# Patient Record
Sex: Female | Born: 1945
Health system: Southern US, Community
[De-identification: ages and names within clinical notes are randomized; demographics above are authoritative.]

## PROBLEM LIST (undated history)

## (undated) DIAGNOSIS — E7800 Pure hypercholesterolemia, unspecified: Secondary | ICD-10-CM

## (undated) DIAGNOSIS — J45991 Cough variant asthma: Secondary | ICD-10-CM

## (undated) DIAGNOSIS — M810 Age-related osteoporosis without current pathological fracture: Secondary | ICD-10-CM

## (undated) DIAGNOSIS — N898 Other specified noninflammatory disorders of vagina: Secondary | ICD-10-CM

## (undated) DIAGNOSIS — R253 Fasciculation: Secondary | ICD-10-CM

## (undated) DIAGNOSIS — B690 Cysticercosis of central nervous system: Secondary | ICD-10-CM

## (undated) DIAGNOSIS — Z79899 Other long term (current) drug therapy: Secondary | ICD-10-CM

## (undated) DIAGNOSIS — B351 Tinea unguium: Secondary | ICD-10-CM

## (undated) DIAGNOSIS — E559 Vitamin D deficiency, unspecified: Secondary | ICD-10-CM

## (undated) DIAGNOSIS — R1310 Dysphagia, unspecified: Secondary | ICD-10-CM

## (undated) DIAGNOSIS — G4730 Sleep apnea, unspecified: Secondary | ICD-10-CM

## (undated) DIAGNOSIS — I700 Atherosclerosis of aorta: Secondary | ICD-10-CM

## (undated) DIAGNOSIS — N951 Menopausal and female climacteric states: Secondary | ICD-10-CM

## (undated) DIAGNOSIS — H269 Unspecified cataract: Secondary | ICD-10-CM

## (undated) DIAGNOSIS — R490 Dysphonia: Secondary | ICD-10-CM

## (undated) DIAGNOSIS — L218 Other seborrheic dermatitis: Secondary | ICD-10-CM

## (undated) HISTORY — PX: ABDOMINAL HYSTERECTOMY: SUR658

## (undated) HISTORY — DX: Age-related osteoporosis without current pathological fracture: M81.0

## (undated) HISTORY — PX: LESION REMOVAL: SHX5196

## (undated) HISTORY — DX: Sleep apnea, unspecified: G47.30

## (undated) HISTORY — DX: Dysphonia: R49.0

## (undated) HISTORY — DX: Unspecified cataract: H26.9

## (undated) HISTORY — DX: Menopausal and female climacteric states: N95.1

## (undated) HISTORY — DX: Atherosclerosis of aorta: I70.0

## (undated) HISTORY — DX: Other seborrheic dermatitis: L21.8

## (undated) HISTORY — DX: Dysphagia, unspecified: R13.10

## (undated) HISTORY — DX: Cough variant asthma: J45.991

## (undated) HISTORY — PX: EYE MUSCLE SURGERY: SHX370

## (undated) HISTORY — DX: Other long term (current) drug therapy: Z79.899

## (undated) HISTORY — DX: Other specified noninflammatory disorders of vagina: N89.8

## (undated) HISTORY — DX: Fasciculation: R25.3

## (undated) HISTORY — PX: ESOPHAGOGASTRODUODENOSCOPY: SHX1529

## (undated) HISTORY — DX: Pure hypercholesterolemia, unspecified: E78.00

## (undated) HISTORY — DX: Tinea unguium: B35.1

## (undated) HISTORY — DX: Vitamin D deficiency, unspecified: E55.9

## (undated) HISTORY — PX: TONSILLECTOMY: SUR1361

## (undated) HISTORY — PX: WISDOM TOOTH EXTRACTION: SHX21

## (undated) HISTORY — DX: Cysticercosis of central nervous system: B69.0

---

## 2000-01-31 ENCOUNTER — Encounter: Payer: Self-pay | Admitting: Family Medicine

## 2000-01-31 ENCOUNTER — Encounter: Admission: RE | Admit: 2000-01-31 | Discharge: 2000-01-31 | Payer: Self-pay | Admitting: Family Medicine

## 2000-05-17 ENCOUNTER — Encounter (INDEPENDENT_AMBULATORY_CARE_PROVIDER_SITE_OTHER): Payer: Self-pay

## 2000-05-18 ENCOUNTER — Other Ambulatory Visit: Admission: RE | Admit: 2000-05-18 | Discharge: 2000-05-18 | Payer: Self-pay | Admitting: Obstetrics and Gynecology

## 2001-02-06 ENCOUNTER — Encounter: Admission: RE | Admit: 2001-02-06 | Discharge: 2001-02-06 | Payer: Self-pay | Admitting: Obstetrics and Gynecology

## 2001-02-06 ENCOUNTER — Encounter: Payer: Self-pay | Admitting: Obstetrics and Gynecology

## 2001-08-21 ENCOUNTER — Encounter: Payer: Self-pay | Admitting: Family Medicine

## 2001-08-21 ENCOUNTER — Encounter: Admission: RE | Admit: 2001-08-21 | Discharge: 2001-08-21 | Payer: Self-pay | Admitting: Family Medicine

## 2002-02-07 ENCOUNTER — Encounter: Admission: RE | Admit: 2002-02-07 | Discharge: 2002-02-07 | Payer: Self-pay | Admitting: Family Medicine

## 2002-02-07 ENCOUNTER — Encounter: Payer: Self-pay | Admitting: Family Medicine

## 2003-03-05 ENCOUNTER — Encounter: Admission: RE | Admit: 2003-03-05 | Discharge: 2003-03-05 | Payer: Self-pay | Admitting: Family Medicine

## 2003-03-05 ENCOUNTER — Encounter: Payer: Self-pay | Admitting: Family Medicine

## 2003-07-01 ENCOUNTER — Encounter: Admission: RE | Admit: 2003-07-01 | Discharge: 2003-08-03 | Payer: Self-pay | Admitting: Family Medicine

## 2004-05-06 ENCOUNTER — Encounter: Admission: RE | Admit: 2004-05-06 | Discharge: 2004-05-06 | Payer: Self-pay | Admitting: Family Medicine

## 2004-12-19 ENCOUNTER — Ambulatory Visit (HOSPITAL_COMMUNITY): Admission: RE | Admit: 2004-12-19 | Discharge: 2004-12-19 | Payer: Self-pay | Admitting: *Deleted

## 2005-06-21 ENCOUNTER — Encounter: Admission: RE | Admit: 2005-06-21 | Discharge: 2005-06-21 | Payer: Self-pay | Admitting: Family Medicine

## 2005-10-30 ENCOUNTER — Encounter: Admission: RE | Admit: 2005-10-30 | Discharge: 2005-12-18 | Payer: Self-pay | Admitting: Family Medicine

## 2005-11-13 ENCOUNTER — Ambulatory Visit: Payer: Self-pay | Admitting: Internal Medicine

## 2005-12-06 ENCOUNTER — Ambulatory Visit (HOSPITAL_COMMUNITY): Admission: RE | Admit: 2005-12-06 | Discharge: 2005-12-06 | Payer: Self-pay | Admitting: Internal Medicine

## 2005-12-14 ENCOUNTER — Ambulatory Visit: Payer: Self-pay | Admitting: Internal Medicine

## 2006-01-10 ENCOUNTER — Ambulatory Visit: Payer: Self-pay | Admitting: Internal Medicine

## 2006-07-03 ENCOUNTER — Encounter: Admission: RE | Admit: 2006-07-03 | Discharge: 2006-07-03 | Payer: Self-pay | Admitting: Family Medicine

## 2006-11-21 ENCOUNTER — Other Ambulatory Visit: Admission: RE | Admit: 2006-11-21 | Discharge: 2006-11-21 | Payer: Self-pay | Admitting: Family Medicine

## 2007-07-05 ENCOUNTER — Encounter: Admission: RE | Admit: 2007-07-05 | Discharge: 2007-07-05 | Payer: Self-pay | Admitting: Family Medicine

## 2008-07-21 ENCOUNTER — Encounter: Admission: RE | Admit: 2008-07-21 | Discharge: 2008-07-21 | Payer: Self-pay | Admitting: Family Medicine

## 2009-08-05 ENCOUNTER — Encounter: Admission: RE | Admit: 2009-08-05 | Discharge: 2009-08-05 | Payer: Self-pay | Admitting: Family Medicine

## 2009-11-18 ENCOUNTER — Ambulatory Visit (HOSPITAL_COMMUNITY): Admission: RE | Admit: 2009-11-18 | Discharge: 2009-11-18 | Payer: Self-pay | Admitting: Gastroenterology

## 2010-02-27 ENCOUNTER — Emergency Department (HOSPITAL_COMMUNITY): Admission: EM | Admit: 2010-02-27 | Discharge: 2010-02-27 | Payer: Self-pay | Admitting: Emergency Medicine

## 2010-08-08 ENCOUNTER — Encounter: Admission: RE | Admit: 2010-08-08 | Discharge: 2010-08-08 | Payer: Self-pay | Admitting: Family Medicine

## 2010-11-16 ENCOUNTER — Other Ambulatory Visit: Payer: Self-pay | Admitting: Family Medicine

## 2010-11-16 ENCOUNTER — Ambulatory Visit
Admission: RE | Admit: 2010-11-16 | Discharge: 2010-11-16 | Disposition: A | Discharge status: Home / Self Care | Payer: BC Managed Care – PPO | Source: Ambulatory Visit | Attending: Family Medicine | Admitting: Family Medicine

## 2010-11-16 DIAGNOSIS — M542 Cervicalgia: Secondary | ICD-10-CM

## 2010-11-16 DIAGNOSIS — M5410 Radiculopathy, site unspecified: Secondary | ICD-10-CM

## 2010-11-22 ENCOUNTER — Other Ambulatory Visit: Payer: Self-pay | Admitting: Family Medicine

## 2010-11-22 DIAGNOSIS — D496 Neoplasm of unspecified behavior of brain: Secondary | ICD-10-CM

## 2010-11-28 ENCOUNTER — Ambulatory Visit
Admission: RE | Admit: 2010-11-28 | Discharge: 2010-11-28 | Disposition: A | Discharge status: Home / Self Care | Payer: BC Managed Care – PPO | Source: Ambulatory Visit | Attending: Family Medicine | Admitting: Family Medicine

## 2010-11-28 DIAGNOSIS — D496 Neoplasm of unspecified behavior of brain: Secondary | ICD-10-CM

## 2011-05-08 ENCOUNTER — Other Ambulatory Visit: Payer: Self-pay | Admitting: Neurosurgery

## 2011-05-08 DIAGNOSIS — G930 Cerebral cysts: Secondary | ICD-10-CM

## 2011-06-07 ENCOUNTER — Ambulatory Visit
Admission: RE | Admit: 2011-06-07 | Discharge: 2011-06-07 | Disposition: A | Discharge status: Home / Self Care | Payer: Medicare Other | Source: Ambulatory Visit | Attending: Neurosurgery | Admitting: Neurosurgery

## 2011-06-07 DIAGNOSIS — G930 Cerebral cysts: Secondary | ICD-10-CM

## 2011-07-11 ENCOUNTER — Other Ambulatory Visit: Payer: Self-pay | Admitting: Obstetrics and Gynecology

## 2011-07-11 DIAGNOSIS — Z1231 Encounter for screening mammogram for malignant neoplasm of breast: Secondary | ICD-10-CM

## 2011-08-17 ENCOUNTER — Ambulatory Visit: Payer: Medicare Other

## 2011-08-30 ENCOUNTER — Ambulatory Visit: Payer: Medicare Other

## 2011-09-21 ENCOUNTER — Ambulatory Visit
Admission: RE | Admit: 2011-09-21 | Discharge: 2011-09-21 | Disposition: A | Discharge status: Home / Self Care | Payer: Medicare Other | Source: Ambulatory Visit | Attending: Obstetrics and Gynecology | Admitting: Obstetrics and Gynecology

## 2011-09-21 DIAGNOSIS — Z1231 Encounter for screening mammogram for malignant neoplasm of breast: Secondary | ICD-10-CM

## 2011-11-08 DIAGNOSIS — E7800 Pure hypercholesterolemia, unspecified: Secondary | ICD-10-CM | POA: Diagnosis not present

## 2011-11-08 DIAGNOSIS — M949 Disorder of cartilage, unspecified: Secondary | ICD-10-CM | POA: Diagnosis not present

## 2011-11-08 DIAGNOSIS — Z0000 Encounter for general adult medical examination without abnormal findings: Secondary | ICD-10-CM | POA: Diagnosis not present

## 2011-11-08 DIAGNOSIS — Z79899 Other long term (current) drug therapy: Secondary | ICD-10-CM | POA: Diagnosis not present

## 2011-11-08 DIAGNOSIS — M899 Disorder of bone, unspecified: Secondary | ICD-10-CM | POA: Diagnosis not present

## 2011-12-12 DIAGNOSIS — H26499 Other secondary cataract, unspecified eye: Secondary | ICD-10-CM | POA: Diagnosis not present

## 2011-12-12 DIAGNOSIS — H2510 Age-related nuclear cataract, unspecified eye: Secondary | ICD-10-CM | POA: Diagnosis not present

## 2011-12-12 DIAGNOSIS — H04129 Dry eye syndrome of unspecified lacrimal gland: Secondary | ICD-10-CM | POA: Diagnosis not present

## 2011-12-12 DIAGNOSIS — H1045 Other chronic allergic conjunctivitis: Secondary | ICD-10-CM | POA: Diagnosis not present

## 2012-01-09 DIAGNOSIS — M79609 Pain in unspecified limb: Secondary | ICD-10-CM | POA: Diagnosis not present

## 2012-01-09 DIAGNOSIS — B351 Tinea unguium: Secondary | ICD-10-CM | POA: Diagnosis not present

## 2012-02-06 DIAGNOSIS — B351 Tinea unguium: Secondary | ICD-10-CM | POA: Diagnosis not present

## 2012-02-06 DIAGNOSIS — M79609 Pain in unspecified limb: Secondary | ICD-10-CM | POA: Diagnosis not present

## 2012-04-08 DIAGNOSIS — L259 Unspecified contact dermatitis, unspecified cause: Secondary | ICD-10-CM | POA: Diagnosis not present

## 2012-05-07 DIAGNOSIS — B351 Tinea unguium: Secondary | ICD-10-CM | POA: Diagnosis not present

## 2012-05-07 DIAGNOSIS — M79609 Pain in unspecified limb: Secondary | ICD-10-CM | POA: Diagnosis not present

## 2012-06-30 DIAGNOSIS — Z23 Encounter for immunization: Secondary | ICD-10-CM | POA: Diagnosis not present

## 2012-09-02 ENCOUNTER — Other Ambulatory Visit: Payer: Self-pay | Admitting: Obstetrics and Gynecology

## 2012-09-02 DIAGNOSIS — Z1231 Encounter for screening mammogram for malignant neoplasm of breast: Secondary | ICD-10-CM

## 2012-09-23 DIAGNOSIS — H5310 Unspecified subjective visual disturbances: Secondary | ICD-10-CM | POA: Diagnosis not present

## 2012-09-23 DIAGNOSIS — H43819 Vitreous degeneration, unspecified eye: Secondary | ICD-10-CM | POA: Diagnosis not present

## 2012-09-23 DIAGNOSIS — H5319 Other subjective visual disturbances: Secondary | ICD-10-CM | POA: Diagnosis not present

## 2012-09-23 DIAGNOSIS — H4310 Vitreous hemorrhage, unspecified eye: Secondary | ICD-10-CM | POA: Diagnosis not present

## 2012-10-08 DIAGNOSIS — M47812 Spondylosis without myelopathy or radiculopathy, cervical region: Secondary | ICD-10-CM | POA: Diagnosis not present

## 2012-10-11 ENCOUNTER — Ambulatory Visit
Admission: RE | Admit: 2012-10-11 | Discharge: 2012-10-11 | Disposition: A | Discharge status: Home / Self Care | Payer: Medicare Other | Source: Ambulatory Visit | Attending: Obstetrics and Gynecology | Admitting: Obstetrics and Gynecology

## 2012-10-11 DIAGNOSIS — Z1231 Encounter for screening mammogram for malignant neoplasm of breast: Secondary | ICD-10-CM | POA: Diagnosis not present

## 2012-10-21 ENCOUNTER — Other Ambulatory Visit: Payer: Self-pay | Admitting: Neurosurgery

## 2012-10-21 DIAGNOSIS — M47812 Spondylosis without myelopathy or radiculopathy, cervical region: Secondary | ICD-10-CM

## 2012-10-21 DIAGNOSIS — G930 Cerebral cysts: Secondary | ICD-10-CM

## 2012-10-26 ENCOUNTER — Ambulatory Visit
Admission: RE | Admit: 2012-10-26 | Discharge: 2012-10-26 | Disposition: A | Discharge status: Home / Self Care | Payer: Medicare Other | Source: Ambulatory Visit | Attending: Neurosurgery | Admitting: Neurosurgery

## 2012-10-26 DIAGNOSIS — M47812 Spondylosis without myelopathy or radiculopathy, cervical region: Secondary | ICD-10-CM

## 2012-10-26 DIAGNOSIS — G930 Cerebral cysts: Secondary | ICD-10-CM

## 2012-11-07 DIAGNOSIS — M47812 Spondylosis without myelopathy or radiculopathy, cervical region: Secondary | ICD-10-CM | POA: Diagnosis not present

## 2012-11-07 DIAGNOSIS — H04129 Dry eye syndrome of unspecified lacrimal gland: Secondary | ICD-10-CM | POA: Diagnosis not present

## 2012-11-07 DIAGNOSIS — H4310 Vitreous hemorrhage, unspecified eye: Secondary | ICD-10-CM | POA: Diagnosis not present

## 2012-11-07 DIAGNOSIS — H43819 Vitreous degeneration, unspecified eye: Secondary | ICD-10-CM | POA: Diagnosis not present

## 2012-11-07 DIAGNOSIS — H2510 Age-related nuclear cataract, unspecified eye: Secondary | ICD-10-CM | POA: Diagnosis not present

## 2012-11-08 DIAGNOSIS — M949 Disorder of cartilage, unspecified: Secondary | ICD-10-CM | POA: Diagnosis not present

## 2012-11-08 DIAGNOSIS — E7800 Pure hypercholesterolemia, unspecified: Secondary | ICD-10-CM | POA: Diagnosis not present

## 2012-11-08 DIAGNOSIS — M899 Disorder of bone, unspecified: Secondary | ICD-10-CM | POA: Diagnosis not present

## 2012-11-08 DIAGNOSIS — R439 Unspecified disturbances of smell and taste: Secondary | ICD-10-CM | POA: Diagnosis not present

## 2012-11-08 DIAGNOSIS — M25559 Pain in unspecified hip: Secondary | ICD-10-CM | POA: Diagnosis not present

## 2012-11-08 DIAGNOSIS — Z79899 Other long term (current) drug therapy: Secondary | ICD-10-CM | POA: Diagnosis not present

## 2012-11-18 DIAGNOSIS — D2360 Other benign neoplasm of skin of unspecified upper limb, including shoulder: Secondary | ICD-10-CM | POA: Diagnosis not present

## 2012-11-19 DIAGNOSIS — J342 Deviated nasal septum: Secondary | ICD-10-CM | POA: Diagnosis not present

## 2012-11-19 DIAGNOSIS — R439 Unspecified disturbances of smell and taste: Secondary | ICD-10-CM | POA: Diagnosis not present

## 2012-12-02 DIAGNOSIS — R509 Fever, unspecified: Secondary | ICD-10-CM | POA: Diagnosis not present

## 2012-12-23 DIAGNOSIS — M899 Disorder of bone, unspecified: Secondary | ICD-10-CM | POA: Diagnosis not present

## 2013-02-06 DIAGNOSIS — R439 Unspecified disturbances of smell and taste: Secondary | ICD-10-CM | POA: Diagnosis not present

## 2013-02-18 DIAGNOSIS — J342 Deviated nasal septum: Secondary | ICD-10-CM | POA: Diagnosis not present

## 2013-02-18 DIAGNOSIS — R439 Unspecified disturbances of smell and taste: Secondary | ICD-10-CM | POA: Diagnosis not present

## 2013-03-14 DIAGNOSIS — R439 Unspecified disturbances of smell and taste: Secondary | ICD-10-CM | POA: Diagnosis not present

## 2013-03-14 DIAGNOSIS — E7800 Pure hypercholesterolemia, unspecified: Secondary | ICD-10-CM | POA: Diagnosis not present

## 2013-03-14 DIAGNOSIS — R509 Fever, unspecified: Secondary | ICD-10-CM | POA: Diagnosis not present

## 2013-03-14 DIAGNOSIS — Z79899 Other long term (current) drug therapy: Secondary | ICD-10-CM | POA: Diagnosis not present

## 2013-03-14 DIAGNOSIS — D485 Neoplasm of uncertain behavior of skin: Secondary | ICD-10-CM | POA: Diagnosis not present

## 2013-03-14 DIAGNOSIS — M25559 Pain in unspecified hip: Secondary | ICD-10-CM | POA: Diagnosis not present

## 2013-03-14 DIAGNOSIS — M899 Disorder of bone, unspecified: Secondary | ICD-10-CM | POA: Diagnosis not present

## 2013-03-19 DIAGNOSIS — Z79899 Other long term (current) drug therapy: Secondary | ICD-10-CM | POA: Diagnosis not present

## 2013-03-19 DIAGNOSIS — E7800 Pure hypercholesterolemia, unspecified: Secondary | ICD-10-CM | POA: Diagnosis not present

## 2013-03-19 DIAGNOSIS — B351 Tinea unguium: Secondary | ICD-10-CM | POA: Diagnosis not present

## 2013-06-30 DIAGNOSIS — Z23 Encounter for immunization: Secondary | ICD-10-CM | POA: Diagnosis not present

## 2013-07-08 ENCOUNTER — Emergency Department (INDEPENDENT_AMBULATORY_CARE_PROVIDER_SITE_OTHER)
Admission: EM | Admit: 2013-07-08 | Discharge: 2013-07-08 | Disposition: A | Discharge status: Home / Self Care | Payer: Medicare Other | Source: Home / Self Care | Attending: Family Medicine | Admitting: Family Medicine

## 2013-07-08 ENCOUNTER — Encounter (HOSPITAL_COMMUNITY): Payer: Self-pay | Admitting: Emergency Medicine

## 2013-07-08 ENCOUNTER — Emergency Department (INDEPENDENT_AMBULATORY_CARE_PROVIDER_SITE_OTHER): Payer: Medicare Other

## 2013-07-08 DIAGNOSIS — S8990XA Unspecified injury of unspecified lower leg, initial encounter: Secondary | ICD-10-CM

## 2013-07-08 DIAGNOSIS — M25579 Pain in unspecified ankle and joints of unspecified foot: Secondary | ICD-10-CM | POA: Diagnosis not present

## 2013-07-08 DIAGNOSIS — S99911A Unspecified injury of right ankle, initial encounter: Secondary | ICD-10-CM

## 2013-07-08 DIAGNOSIS — M7989 Other specified soft tissue disorders: Secondary | ICD-10-CM | POA: Diagnosis not present

## 2013-07-08 MED ORDER — TRAMADOL HCL 50 MG PO TABS: 50.0000 mg | ORAL_TABLET | Freq: Four times a day (QID) | ORAL | Status: DC | PRN

## 2013-07-08 NOTE — ED Provider Notes (Signed)
CSN: [id number]     Arrival date & time 07/08/13  0815 History   First MD Initiated Contact with Patient 07/08/13 [phone]     Chief Complaint  Patient presents with  . Fall   (Consider location/radiation/quality/duration/timing/severity/associated sxs/prior Treatment) HPI Comments: 67 year old female presents complaining of injuries after falling yesterday. She was walking and stepped on a branch moved under her and caused her to fall. The most significant pain today is in her right ankle which is swollen, and she has limited ability to bear weight. The pain is localized to her lateral right ankle. She denies any numbness distal to this. She does not remember if she inverted the ankle were hit it on anything because she states it happened too fast. She denies hitting her head. She also has a normal swelling of her left hand and mild pain, but this has improved significantly since yesterday.  She also has a slight abrasion on her left knee.    Patient is a 67 y.o. female presenting with fall.  Fall Pertinent negatives include no chest pain, no abdominal pain and no shortness of breath.    History reviewed. No pertinent past medical history. History reviewed. No pertinent past surgical history. No family history on file. History  Substance Use Topics  . Smoking status: Never Smoker   . Smokeless tobacco: Not on file  . Alcohol Use: No   OB History   Grav Para Term Preterm Abortions TAB SAB Ect Mult Living                 Review of Systems  Constitutional: Negative for fever and chills.  Eyes: Negative for visual disturbance.  Respiratory: Negative for cough and shortness of breath.   Cardiovascular: Negative for chest pain, palpitations and leg swelling.  Gastrointestinal: Negative for nausea, vomiting and abdominal pain.  Endocrine: Negative for polydipsia and polyuria.  Genitourinary: Negative for dysuria, urgency and frequency.  Musculoskeletal: Positive for joint swelling and  arthralgias. Negative for myalgias.  Skin: Positive for wound. Negative for rash.  Neurological: Negative for dizziness, weakness and light-headedness.    Allergies  Review of patient's allergies indicates no known allergies.  Home Medications   Current Outpatient Rx  Name  Route  Sig  Dispense  Refill  . aspirin 81 MG tablet   Oral   Take 81 mg by mouth daily.         Marland Kitchen atorvastatin (LIPITOR) 10 MG tablet   Oral   Take 10 mg by mouth daily.         Marland Kitchen estradiol (VIVELLE-DOT) 0.05 MG/24HR patch   Transdermal   Place 1 patch onto the skin once a week.         . MULTIPLE VITAMIN PO   Oral   Take by mouth.         Marland Kitchen VITAMIN D, CHOLECALCIFEROL, PO   Oral   Take by mouth.         . traMADol (ULTRAM) 50 MG tablet   Oral   Take 1 tablet (50 mg total) by mouth every 6 (six) hours as needed for pain.   15 tablet   0    BP 154/80  Pulse 94  Temp(Src) 98.9 F (37.2 C) (Oral)  Resp 18  SpO2 97% Physical Exam  Nursing note and vitals reviewed. Constitutional: She is oriented to person, place, and time. Vital signs are normal. She appears well-developed and well-nourished. No distress.  HENT:  Head: Normocephalic and atraumatic.  Pulmonary/Chest:  Effort normal. No respiratory distress.  Musculoskeletal:       Right ankle: She exhibits decreased range of motion and swelling. She exhibits no ecchymosis and normal pulse. Tenderness. Lateral malleolus tenderness found. No medial malleolus, no AITFL, no CF ligament, no posterior TFL, no head of 5th metatarsal and no proximal fibula tenderness found. Achilles tendon normal.       Hands:      Legs:      Feet:  Neurological: She is alert and oriented to person, place, and time. She has normal strength. No cranial nerve deficit. Coordination normal.  Skin: Skin is warm and dry. No rash noted. She is not diaphoretic.  Psychiatric: She has a normal mood and affect. Judgment normal.    ED Course  Procedures (including  critical care time) Labs Review Labs Reviewed - No data to display Imaging Review Dg Ankle Complete Right  07/08/2013   CLINICAL DATA:  Traumatic injury with pain  EXAM: RIGHT ANKLE - COMPLETE 3+ VIEW  COMPARISON:  03/30/2010  FINDINGS: Lateral soft tissue swelling is identified. A lucency is noted in the distal fibula similar to that seen on the prior exam. This is consistent with prior trauma and nonunion. No acute fracture is seen.  IMPRESSION: Changes consistent with prior trauma.  Acute soft tissue swelling laterally without bony injury.   Electronically Signed   By: Alcide Clever M.D.   On: 07/08/2013 09:16    MDM   1. Ankle injury, right, initial encounter    No radiographic evidence of fracture. Patient has a walking boot with her from a previous ankle injury as well as crutches. She will use these for a few days. RIC E. Tramadol when necessary. Followup in one week if not significantly improved   Meds ordered this encounter  Medications  . traMADol (ULTRAM) 50 MG tablet    Sig: Take 1 tablet (50 mg total) by mouth every 6 (six) hours as needed for pain.    Dispense:  15 tablet    Refill:  0    Order Specific Question:  Supervising Provider    Answer:  Lorenz Coaster, DAVID C [[id number]]      Graylon Good, PA-C 07/08/13 [phone]

## 2013-07-08 NOTE — ED Notes (Signed)
Pt c/o inj to right ankle and left hand onset yest around 1600 Reports she fell while walking onto asphalt ground... Reports she stepped on a stick and lost her balance Sxs include: swelling to lateral malleolus and painful to bear wt.  Also c/o left hand swelling and bruising... Unable to lift heavy objects Has multiple abrasions on nose and right hand Pt was brought in a wheelchair. Denies: head inj/LOC Alert w/no signs of acute distress.

## 2013-07-09 NOTE — ED Provider Notes (Signed)
Medical screening examination/treatment/procedure(s) were performed by resident physician or non-physician practitioner and as supervising physician I was immediately available for consultation/collaboration.   Ott Zimmerle DOUGLAS MD.   Annalissa Murphey D Hawley Michel, MD 07/09/13 2114 

## 2013-07-23 DIAGNOSIS — Z79899 Other long term (current) drug therapy: Secondary | ICD-10-CM | POA: Diagnosis not present

## 2013-07-23 DIAGNOSIS — E7800 Pure hypercholesterolemia, unspecified: Secondary | ICD-10-CM | POA: Diagnosis not present

## 2013-07-23 DIAGNOSIS — B351 Tinea unguium: Secondary | ICD-10-CM | POA: Diagnosis not present

## 2013-07-30 DIAGNOSIS — B351 Tinea unguium: Secondary | ICD-10-CM | POA: Diagnosis not present

## 2013-07-30 DIAGNOSIS — E7800 Pure hypercholesterolemia, unspecified: Secondary | ICD-10-CM | POA: Diagnosis not present

## 2013-10-06 ENCOUNTER — Other Ambulatory Visit: Payer: Self-pay

## 2013-10-06 DIAGNOSIS — Z1231 Encounter for screening mammogram for malignant neoplasm of breast: Secondary | ICD-10-CM

## 2013-10-22 ENCOUNTER — Ambulatory Visit
Admission: RE | Admit: 2013-10-22 | Discharge: 2013-10-22 | Disposition: A | Discharge status: Home / Self Care | Payer: Medicare Other | Source: Ambulatory Visit

## 2013-10-22 DIAGNOSIS — Z1231 Encounter for screening mammogram for malignant neoplasm of breast: Secondary | ICD-10-CM

## 2013-11-19 DIAGNOSIS — H1045 Other chronic allergic conjunctivitis: Secondary | ICD-10-CM | POA: Diagnosis not present

## 2013-11-19 DIAGNOSIS — H04129 Dry eye syndrome of unspecified lacrimal gland: Secondary | ICD-10-CM | POA: Diagnosis not present

## 2013-11-19 DIAGNOSIS — H43819 Vitreous degeneration, unspecified eye: Secondary | ICD-10-CM | POA: Diagnosis not present

## 2014-05-29 DIAGNOSIS — Z79899 Other long term (current) drug therapy: Secondary | ICD-10-CM | POA: Diagnosis not present

## 2014-05-29 DIAGNOSIS — B351 Tinea unguium: Secondary | ICD-10-CM | POA: Diagnosis not present

## 2014-05-29 DIAGNOSIS — E7800 Pure hypercholesterolemia, unspecified: Secondary | ICD-10-CM | POA: Diagnosis not present

## 2014-06-01 DIAGNOSIS — E7800 Pure hypercholesterolemia, unspecified: Secondary | ICD-10-CM | POA: Diagnosis not present

## 2014-06-01 DIAGNOSIS — Z0000 Encounter for general adult medical examination without abnormal findings: Secondary | ICD-10-CM | POA: Diagnosis not present

## 2014-06-01 DIAGNOSIS — Z23 Encounter for immunization: Secondary | ICD-10-CM | POA: Diagnosis not present

## 2014-06-01 DIAGNOSIS — M899 Disorder of bone, unspecified: Secondary | ICD-10-CM | POA: Diagnosis not present

## 2014-06-01 DIAGNOSIS — M949 Disorder of cartilage, unspecified: Secondary | ICD-10-CM | POA: Diagnosis not present

## 2014-06-26 DIAGNOSIS — Z23 Encounter for immunization: Secondary | ICD-10-CM | POA: Diagnosis not present

## 2014-10-05 DIAGNOSIS — R253 Fasciculation: Secondary | ICD-10-CM | POA: Diagnosis not present

## 2014-10-05 DIAGNOSIS — L821 Other seborrheic keratosis: Secondary | ICD-10-CM | POA: Diagnosis not present

## 2014-10-05 DIAGNOSIS — B351 Tinea unguium: Secondary | ICD-10-CM | POA: Diagnosis not present

## 2014-11-30 ENCOUNTER — Other Ambulatory Visit: Payer: Self-pay

## 2014-11-30 DIAGNOSIS — Z1231 Encounter for screening mammogram for malignant neoplasm of breast: Secondary | ICD-10-CM

## 2014-12-23 ENCOUNTER — Ambulatory Visit: Payer: No Typology Code available for payment source

## 2014-12-25 ENCOUNTER — Ambulatory Visit
Admission: RE | Admit: 2014-12-25 | Discharge: 2014-12-25 | Disposition: A | Discharge status: Home / Self Care | Payer: Medicare Other | Source: Ambulatory Visit

## 2014-12-25 DIAGNOSIS — Z1231 Encounter for screening mammogram for malignant neoplasm of breast: Secondary | ICD-10-CM

## 2015-01-26 DIAGNOSIS — H43811 Vitreous degeneration, right eye: Secondary | ICD-10-CM | POA: Diagnosis not present

## 2015-01-26 DIAGNOSIS — H2511 Age-related nuclear cataract, right eye: Secondary | ICD-10-CM | POA: Diagnosis not present

## 2015-01-26 DIAGNOSIS — Q120 Congenital cataract: Secondary | ICD-10-CM | POA: Diagnosis not present

## 2015-02-10 DIAGNOSIS — M791 Myalgia: Secondary | ICD-10-CM | POA: Diagnosis not present

## 2015-02-16 DIAGNOSIS — M25552 Pain in left hip: Secondary | ICD-10-CM | POA: Diagnosis not present

## 2015-02-17 DIAGNOSIS — M25552 Pain in left hip: Secondary | ICD-10-CM | POA: Diagnosis not present

## 2015-02-22 DIAGNOSIS — M25552 Pain in left hip: Secondary | ICD-10-CM | POA: Diagnosis not present

## 2015-02-24 DIAGNOSIS — M25552 Pain in left hip: Secondary | ICD-10-CM | POA: Diagnosis not present

## 2015-02-26 DIAGNOSIS — M25552 Pain in left hip: Secondary | ICD-10-CM | POA: Diagnosis not present

## 2015-03-02 DIAGNOSIS — M25552 Pain in left hip: Secondary | ICD-10-CM | POA: Diagnosis not present

## 2015-03-04 DIAGNOSIS — M25552 Pain in left hip: Secondary | ICD-10-CM | POA: Diagnosis not present

## 2015-03-08 DIAGNOSIS — M25552 Pain in left hip: Secondary | ICD-10-CM | POA: Diagnosis not present

## 2015-03-10 DIAGNOSIS — M25552 Pain in left hip: Secondary | ICD-10-CM | POA: Diagnosis not present

## 2015-03-12 DIAGNOSIS — M25552 Pain in left hip: Secondary | ICD-10-CM | POA: Diagnosis not present

## 2015-03-15 DIAGNOSIS — M25552 Pain in left hip: Secondary | ICD-10-CM | POA: Diagnosis not present

## 2015-04-07 DIAGNOSIS — M25552 Pain in left hip: Secondary | ICD-10-CM | POA: Diagnosis not present

## 2015-04-12 DIAGNOSIS — K640 First degree hemorrhoids: Secondary | ICD-10-CM | POA: Diagnosis not present

## 2015-04-12 DIAGNOSIS — Z1211 Encounter for screening for malignant neoplasm of colon: Secondary | ICD-10-CM | POA: Diagnosis not present

## 2015-04-12 DIAGNOSIS — K5730 Diverticulosis of large intestine without perforation or abscess without bleeding: Secondary | ICD-10-CM | POA: Diagnosis not present

## 2015-06-03 DIAGNOSIS — Z0000 Encounter for general adult medical examination without abnormal findings: Secondary | ICD-10-CM | POA: Diagnosis not present

## 2015-06-03 DIAGNOSIS — Z79899 Other long term (current) drug therapy: Secondary | ICD-10-CM | POA: Diagnosis not present

## 2015-06-03 DIAGNOSIS — E780 Pure hypercholesterolemia: Secondary | ICD-10-CM | POA: Diagnosis not present

## 2015-06-08 DIAGNOSIS — M8580 Other specified disorders of bone density and structure, unspecified site: Secondary | ICD-10-CM | POA: Diagnosis not present

## 2015-06-08 DIAGNOSIS — M25559 Pain in unspecified hip: Secondary | ICD-10-CM | POA: Diagnosis not present

## 2015-06-08 DIAGNOSIS — Z0000 Encounter for general adult medical examination without abnormal findings: Secondary | ICD-10-CM | POA: Diagnosis not present

## 2015-06-08 DIAGNOSIS — Z79899 Other long term (current) drug therapy: Secondary | ICD-10-CM | POA: Diagnosis not present

## 2015-06-08 DIAGNOSIS — B351 Tinea unguium: Secondary | ICD-10-CM | POA: Diagnosis not present

## 2015-06-08 DIAGNOSIS — Z23 Encounter for immunization: Secondary | ICD-10-CM | POA: Diagnosis not present

## 2015-06-08 DIAGNOSIS — E780 Pure hypercholesterolemia: Secondary | ICD-10-CM | POA: Diagnosis not present

## 2015-06-08 DIAGNOSIS — R490 Dysphonia: Secondary | ICD-10-CM | POA: Diagnosis not present

## 2015-09-02 DIAGNOSIS — Z79899 Other long term (current) drug therapy: Secondary | ICD-10-CM | POA: Diagnosis not present

## 2015-09-02 DIAGNOSIS — E7800 Pure hypercholesterolemia, unspecified: Secondary | ICD-10-CM | POA: Diagnosis not present

## 2015-09-02 DIAGNOSIS — R490 Dysphonia: Secondary | ICD-10-CM | POA: Diagnosis not present

## 2015-09-02 DIAGNOSIS — Z23 Encounter for immunization: Secondary | ICD-10-CM | POA: Diagnosis not present

## 2015-09-02 DIAGNOSIS — B351 Tinea unguium: Secondary | ICD-10-CM | POA: Diagnosis not present

## 2015-09-02 DIAGNOSIS — Z0000 Encounter for general adult medical examination without abnormal findings: Secondary | ICD-10-CM | POA: Diagnosis not present

## 2015-09-07 DIAGNOSIS — Z79899 Other long term (current) drug therapy: Secondary | ICD-10-CM | POA: Diagnosis not present

## 2015-09-07 DIAGNOSIS — D485 Neoplasm of uncertain behavior of skin: Secondary | ICD-10-CM | POA: Diagnosis not present

## 2015-09-07 DIAGNOSIS — E7800 Pure hypercholesterolemia, unspecified: Secondary | ICD-10-CM | POA: Diagnosis not present

## 2016-01-11 ENCOUNTER — Other Ambulatory Visit: Payer: Self-pay

## 2016-01-11 DIAGNOSIS — Z1231 Encounter for screening mammogram for malignant neoplasm of breast: Secondary | ICD-10-CM

## 2016-02-02 ENCOUNTER — Ambulatory Visit
Admission: RE | Admit: 2016-02-02 | Discharge: 2016-02-02 | Disposition: A | Discharge status: Home / Self Care | Payer: Medicare Other | Source: Ambulatory Visit

## 2016-02-02 DIAGNOSIS — H2511 Age-related nuclear cataract, right eye: Secondary | ICD-10-CM | POA: Diagnosis not present

## 2016-02-02 DIAGNOSIS — Z1231 Encounter for screening mammogram for malignant neoplasm of breast: Secondary | ICD-10-CM | POA: Diagnosis not present

## 2016-02-02 DIAGNOSIS — Q120 Congenital cataract: Secondary | ICD-10-CM | POA: Diagnosis not present

## 2016-05-11 DIAGNOSIS — L258 Unspecified contact dermatitis due to other agents: Secondary | ICD-10-CM | POA: Diagnosis not present

## 2016-07-06 DIAGNOSIS — Z79899 Other long term (current) drug therapy: Secondary | ICD-10-CM | POA: Diagnosis not present

## 2016-07-06 DIAGNOSIS — E7800 Pure hypercholesterolemia, unspecified: Secondary | ICD-10-CM | POA: Diagnosis not present

## 2016-07-10 DIAGNOSIS — E7800 Pure hypercholesterolemia, unspecified: Secondary | ICD-10-CM | POA: Diagnosis not present

## 2016-07-10 DIAGNOSIS — S61419A Laceration without foreign body of unspecified hand, initial encounter: Secondary | ICD-10-CM | POA: Diagnosis not present

## 2016-07-10 DIAGNOSIS — Z0000 Encounter for general adult medical examination without abnormal findings: Secondary | ICD-10-CM | POA: Diagnosis not present

## 2016-07-10 DIAGNOSIS — Z23 Encounter for immunization: Secondary | ICD-10-CM | POA: Diagnosis not present

## 2016-07-10 DIAGNOSIS — M8580 Other specified disorders of bone density and structure, unspecified site: Secondary | ICD-10-CM | POA: Diagnosis not present

## 2016-07-10 DIAGNOSIS — R05 Cough: Secondary | ICD-10-CM | POA: Diagnosis not present

## 2016-08-07 DIAGNOSIS — M8588 Other specified disorders of bone density and structure, other site: Secondary | ICD-10-CM | POA: Diagnosis not present

## 2016-09-01 DIAGNOSIS — N951 Menopausal and female climacteric states: Secondary | ICD-10-CM | POA: Diagnosis not present

## 2016-09-01 DIAGNOSIS — M8580 Other specified disorders of bone density and structure, unspecified site: Secondary | ICD-10-CM | POA: Diagnosis not present

## 2016-11-06 DIAGNOSIS — D485 Neoplasm of uncertain behavior of skin: Secondary | ICD-10-CM | POA: Diagnosis not present

## 2017-01-19 ENCOUNTER — Other Ambulatory Visit: Payer: Self-pay | Admitting: Family Medicine

## 2017-01-19 DIAGNOSIS — Z1231 Encounter for screening mammogram for malignant neoplasm of breast: Secondary | ICD-10-CM

## 2017-02-01 DIAGNOSIS — Q120 Congenital cataract: Secondary | ICD-10-CM | POA: Diagnosis not present

## 2017-02-01 DIAGNOSIS — H2511 Age-related nuclear cataract, right eye: Secondary | ICD-10-CM | POA: Diagnosis not present

## 2017-02-02 DIAGNOSIS — L2489 Irritant contact dermatitis due to other agents: Secondary | ICD-10-CM | POA: Diagnosis not present

## 2017-02-27 ENCOUNTER — Ambulatory Visit
Admission: RE | Admit: 2017-02-27 | Discharge: 2017-02-27 | Disposition: A | Discharge status: Home / Self Care | Payer: Medicare Other | Source: Ambulatory Visit | Attending: Family Medicine | Admitting: Family Medicine

## 2017-02-27 DIAGNOSIS — Z1231 Encounter for screening mammogram for malignant neoplasm of breast: Secondary | ICD-10-CM

## 2017-04-23 DIAGNOSIS — M1812 Unilateral primary osteoarthritis of first carpometacarpal joint, left hand: Secondary | ICD-10-CM | POA: Diagnosis not present

## 2017-04-23 DIAGNOSIS — M65312 Trigger thumb, left thumb: Secondary | ICD-10-CM | POA: Diagnosis not present

## 2017-06-02 DIAGNOSIS — I10 Essential (primary) hypertension: Secondary | ICD-10-CM | POA: Diagnosis not present

## 2017-06-02 DIAGNOSIS — R1030 Lower abdominal pain, unspecified: Secondary | ICD-10-CM | POA: Diagnosis not present

## 2017-06-02 DIAGNOSIS — N390 Urinary tract infection, site not specified: Secondary | ICD-10-CM | POA: Diagnosis not present

## 2017-07-13 DIAGNOSIS — Z23 Encounter for immunization: Secondary | ICD-10-CM | POA: Diagnosis not present

## 2017-07-20 DIAGNOSIS — R030 Elevated blood-pressure reading, without diagnosis of hypertension: Secondary | ICD-10-CM | POA: Diagnosis not present

## 2017-08-10 DIAGNOSIS — E7800 Pure hypercholesterolemia, unspecified: Secondary | ICD-10-CM | POA: Diagnosis not present

## 2017-08-10 DIAGNOSIS — Z79899 Other long term (current) drug therapy: Secondary | ICD-10-CM | POA: Diagnosis not present

## 2017-08-15 DIAGNOSIS — D229 Melanocytic nevi, unspecified: Secondary | ICD-10-CM | POA: Diagnosis not present

## 2017-08-15 DIAGNOSIS — Z820 Family history of epilepsy and other diseases of the nervous system: Secondary | ICD-10-CM | POA: Diagnosis not present

## 2017-08-15 DIAGNOSIS — Z79899 Other long term (current) drug therapy: Secondary | ICD-10-CM | POA: Diagnosis not present

## 2017-08-15 DIAGNOSIS — E7800 Pure hypercholesterolemia, unspecified: Secondary | ICD-10-CM | POA: Diagnosis not present

## 2017-08-15 DIAGNOSIS — N898 Other specified noninflammatory disorders of vagina: Secondary | ICD-10-CM | POA: Diagnosis not present

## 2017-08-15 DIAGNOSIS — Z0000 Encounter for general adult medical examination without abnormal findings: Secondary | ICD-10-CM | POA: Diagnosis not present

## 2017-08-15 DIAGNOSIS — R3915 Urgency of urination: Secondary | ICD-10-CM | POA: Diagnosis not present

## 2017-08-15 DIAGNOSIS — Z1159 Encounter for screening for other viral diseases: Secondary | ICD-10-CM | POA: Diagnosis not present

## 2017-10-05 DIAGNOSIS — N3941 Urge incontinence: Secondary | ICD-10-CM | POA: Diagnosis not present

## 2017-10-05 DIAGNOSIS — R350 Frequency of micturition: Secondary | ICD-10-CM | POA: Diagnosis not present

## 2017-10-05 DIAGNOSIS — R351 Nocturia: Secondary | ICD-10-CM | POA: Diagnosis not present

## 2017-10-15 DIAGNOSIS — D485 Neoplasm of uncertain behavior of skin: Secondary | ICD-10-CM | POA: Diagnosis not present

## 2017-10-15 DIAGNOSIS — D2230 Melanocytic nevi of unspecified part of face: Secondary | ICD-10-CM | POA: Diagnosis not present

## 2017-10-15 DIAGNOSIS — L821 Other seborrheic keratosis: Secondary | ICD-10-CM | POA: Diagnosis not present

## 2017-10-15 DIAGNOSIS — Z23 Encounter for immunization: Secondary | ICD-10-CM | POA: Diagnosis not present

## 2017-10-15 DIAGNOSIS — D1801 Hemangioma of skin and subcutaneous tissue: Secondary | ICD-10-CM | POA: Diagnosis not present

## 2017-10-15 DIAGNOSIS — D2271 Melanocytic nevi of right lower limb, including hip: Secondary | ICD-10-CM | POA: Diagnosis not present

## 2017-10-15 DIAGNOSIS — C44311 Basal cell carcinoma of skin of nose: Secondary | ICD-10-CM | POA: Diagnosis not present

## 2017-11-05 DIAGNOSIS — C44311 Basal cell carcinoma of skin of nose: Secondary | ICD-10-CM | POA: Diagnosis not present

## 2017-11-05 DIAGNOSIS — D485 Neoplasm of uncertain behavior of skin: Secondary | ICD-10-CM | POA: Diagnosis not present

## 2017-11-09 DIAGNOSIS — C44310 Basal cell carcinoma of skin of unspecified parts of face: Secondary | ICD-10-CM | POA: Diagnosis not present

## 2017-11-09 DIAGNOSIS — R490 Dysphonia: Secondary | ICD-10-CM | POA: Diagnosis not present

## 2017-11-09 DIAGNOSIS — R05 Cough: Secondary | ICD-10-CM | POA: Diagnosis not present

## 2017-11-09 DIAGNOSIS — K219 Gastro-esophageal reflux disease without esophagitis: Secondary | ICD-10-CM | POA: Diagnosis not present

## 2017-12-06 DIAGNOSIS — C44311 Basal cell carcinoma of skin of nose: Secondary | ICD-10-CM | POA: Diagnosis not present

## 2017-12-07 DIAGNOSIS — G500 Trigeminal neuralgia: Secondary | ICD-10-CM | POA: Diagnosis not present

## 2018-01-29 DIAGNOSIS — E7800 Pure hypercholesterolemia, unspecified: Secondary | ICD-10-CM | POA: Diagnosis not present

## 2018-01-29 DIAGNOSIS — Z79899 Other long term (current) drug therapy: Secondary | ICD-10-CM | POA: Diagnosis not present

## 2018-01-29 DIAGNOSIS — Z1159 Encounter for screening for other viral diseases: Secondary | ICD-10-CM | POA: Diagnosis not present

## 2018-01-29 DIAGNOSIS — Z0000 Encounter for general adult medical examination without abnormal findings: Secondary | ICD-10-CM | POA: Diagnosis not present

## 2018-02-04 DIAGNOSIS — E7800 Pure hypercholesterolemia, unspecified: Secondary | ICD-10-CM | POA: Diagnosis not present

## 2018-02-04 DIAGNOSIS — R05 Cough: Secondary | ICD-10-CM | POA: Diagnosis not present

## 2018-02-04 DIAGNOSIS — Z79899 Other long term (current) drug therapy: Secondary | ICD-10-CM | POA: Diagnosis not present

## 2018-02-06 DIAGNOSIS — H2511 Age-related nuclear cataract, right eye: Secondary | ICD-10-CM | POA: Diagnosis not present

## 2018-02-06 DIAGNOSIS — H43811 Vitreous degeneration, right eye: Secondary | ICD-10-CM | POA: Diagnosis not present

## 2018-02-06 DIAGNOSIS — Q120 Congenital cataract: Secondary | ICD-10-CM | POA: Diagnosis not present

## 2018-02-12 ENCOUNTER — Encounter: Payer: Self-pay | Admitting: Pulmonary Disease

## 2018-02-12 ENCOUNTER — Ambulatory Visit (INDEPENDENT_AMBULATORY_CARE_PROVIDER_SITE_OTHER): Payer: Medicare Other | Admitting: Pulmonary Disease

## 2018-02-12 VITALS — BP 110/68 | HR 93 | Ht 64.0 in | Wt 113.0 lb

## 2018-02-12 DIAGNOSIS — J849 Interstitial pulmonary disease, unspecified: Secondary | ICD-10-CM | POA: Diagnosis not present

## 2018-02-12 DIAGNOSIS — R05 Cough: Secondary | ICD-10-CM | POA: Diagnosis not present

## 2018-02-12 DIAGNOSIS — R059 Cough, unspecified: Secondary | ICD-10-CM

## 2018-02-12 LAB — NITRIC OXIDE: Nitric Oxide: 19

## 2018-02-12 NOTE — Progress Notes (Signed)
Synopsis: Referred in May 2019 for cough  Subjective:   PATIENT ID: Abigail Hawkins GENDER: female DOB: 06/18/1946, MRN: [id number]   HPI  Chief Complaint  Patient presents with  . New Consult    non-productive cough     Abigail Hawkins is here to see me for a cough: > it has been present for several years > it is "totally involuntary" > worse with laughing, talking a lot it would start and then run its course > the cough is dry, though lately she feels something rattling around in her chest that she feels like needs to come out > She has a right sided cerebellar angle pontine cyst> she was advised that this could possible cause a cough (per Dr. Trenton Gammon) > she says that sometimes she has to concentrate on her swallowing technique, but this is rare > she notes that if she sits to read she will start coughing > when she lie down at night she will cough > whenever she turns over in bed she will  > she doesn't recall any preceding infectious syndrome when she started coughing > eating doesn't bring it on > sometimes drinking a glass of water helps > she will use a cough drop which can help > she has used Delsym which can help a little > she clears her throat > she feels a "bodily urge" to cough, she doesn't feel like there is anything in her throat that makes her cough  Had a 24 hour pH probe: normal;   At one point she would cough up a small "pearl" from time to time.  Abigail Hawkins, he treated her with pepcid for cough that didn't help.  She has had allergy testing that was normal.  She says her sinuses were "checked" (CT?) and was normal.    No history of lung problems.  Normal childhood without respiratory illnesses.  She thinks that she had pneumonia as a young adult.    She never smoked. 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  She once thought that the old building she worked in may have contributed to this.  She retired 6 years ago.    History  reviewed. No pertinent past medical history.   History reviewed. No pertinent family history.   Social History   Socioeconomic History  . Marital status: Married    Spouse name: Not on file  . Number of children: Not on file  . Years of education: Not on file  . Highest education level: Not on file  Occupational History  . Not on file  Social Needs  . Financial resource strain: Not on file  . Food insecurity:    Worry: Not on file    Inability: Not on file  . Transportation needs:    Medical: Not on file    Non-medical: Not on file  Tobacco Use  . Smoking status: Never Smoker  . Smokeless tobacco: Never Used  Substance and Sexual Activity  . Alcohol use: No  . Drug use: No  . Sexual activity: Not on file  Lifestyle  . Physical activity:    Days per week: Not on file    Minutes per session: Not on file  . Stress: Not on file  Relationships  . Social connections:    Talks on phone: Not on file    Gets together: Not on file    Attends religious service: Not on file    Active member of club or organization:  Not on file    Attends meetings of clubs or organizations: Not on file    Relationship status: Not on file  . Intimate partner violence:    Fear of current or ex partner: Not on file    Emotionally abused: Not on file    Physically abused: Not on file    Forced sexual activity: Not on file  Other Topics Concern  . Not on file  Social History Narrative  . Not on file     Allergies  Allergen Reactions  . Penicillin G Rash     Outpatient Medications Prior to Visit  Medication Sig Dispense Refill  . aspirin 81 MG tablet Take 81 mg by mouth daily.    . cetirizine (ZYRTEC) 10 MG tablet Take 10 mg by mouth daily.    . rosuvastatin (CRESTOR) 20 MG tablet Take 20 mg by mouth daily.    Marland Kitchen VITAMIN D, CHOLECALCIFEROL, PO Take by mouth.    . MULTIPLE VITAMIN PO Take by mouth.    Marland Kitchen atorvastatin (LIPITOR) 10 MG tablet Take 10 mg by mouth daily.    Marland Kitchen estradiol  (VIVELLE-DOT) 0.05 MG/24HR patch Place 1 patch onto the skin once a week.    . traMADol (ULTRAM) 50 MG tablet Take 1 tablet (50 mg total) by mouth every 6 (six) hours as needed for pain. (Patient not taking: Reported on 02/12/2018) 15 tablet 0   No facility-administered medications prior to visit.     Review of Systems  Constitutional: Positive for weight loss. Negative for fever.  HENT: Negative for congestion, ear pain, nosebleeds and sore throat.   Eyes: Negative for redness.  Respiratory: Positive for cough. Negative for shortness of breath and wheezing.   Cardiovascular: Negative for palpitations, leg swelling and PND.  Gastrointestinal: Negative for nausea and vomiting.  Genitourinary: Positive for urgency. Negative for dysuria.  Skin: Negative for rash.  Neurological: Negative for headaches.  Endo/Heme/Allergies: Does not bruise/bleed easily.  Psychiatric/Behavioral: Negative for depression. The patient is not nervous/anxious.       Objective:  Physical Exam   Vitals:   02/12/18 1152  BP: 110/68  Pulse: 93  SpO2: 97%  Weight: 113 lb (51.3 kg)  Height: 5\' 4"  (1.626 m)   Gen: well appearing, no acute distress HENT: NCAT, OP clear, neck supple without masses Eyes: PERRL, EOMi Lymph: no cervical lymphadenopathy PULM: CTA B CV: RRR, no mgr, no JVD GI: BS+, soft, nontender, no hsm Derm: no rash or skin breakdown MSK: normal bulk and tone Neuro: A&Ox4, CN II-XII intact, strength 5/5 in all 4 extremities Psyche: normal mood and affect   CBC No results found for: WBC, RBC, HGB, HCT, PLT, MCV, MCH, MCHC, RDW, LYMPHSABS, MONOABS, EOSABS, BASOSABS   Chest imaging: Images not available for me to review but May 2019 chest x-ray showed apical scarring, atelectasis and emphysematous changes  PFT:  Labs:  Path:  Echo:  Heart Catheterization:  Records from her primary care doctor (Dr. Harrington Challenger) reviewed.  She was seen for cough there last week.  She noted that GERD  treatment was ineffective.  She was given a prescription for Tessalon.  A chest x-ray was ordered.     Assessment & Plan:   No diagnosis found.  Discussion: She has a chronic cough which is been present for nearly 10 years now.  She has had an extensive work-up for gastroesophageal reflux disease, sinus disease.  Treatment for these conditions and diet gnostic work-up were unrevealing and were not helpful.  The causes of cough for many.  In her particular case because she has an abnormal chest x-ray I think before we explore extrapulmonary problems (irritable larynx syndrome for example) we need to look for eosinophilic bronchitis, bronchiectasis or interstitial lung disease.  Regardless of the presence of a lung problem I do worry about irritable larynx syndrome based on her symptoms.  Plan: Cough with abnormal chest x-ray: Lung function test High-resolution CT scan of the chest Exhaled nitric oxide test  We will try to track down the records of your 24-hour pH probe.  We will discuss treatment options after we have seen the results of these tests.  Follow up 2-3 weeks    Current Outpatient Medications:  .  aspirin 81 MG tablet, Take 81 mg by mouth daily., Disp: , Rfl:  .  cetirizine (ZYRTEC) 10 MG tablet, Take 10 mg by mouth daily., Disp: , Rfl:  .  rosuvastatin (CRESTOR) 20 MG tablet, Take 20 mg by mouth daily., Disp: , Rfl:  .  VITAMIN D, CHOLECALCIFEROL, PO, Take by mouth., Disp: , Rfl:  .  MULTIPLE VITAMIN PO, Take by mouth., Disp: , Rfl:

## 2018-02-12 NOTE — Patient Instructions (Signed)
Cough with abnormal chest x-ray: Lung function test High-resolution CT scan of the chest Exhaled nitric oxide test  We will try to track down the records of your 24-hour pH probe.  We will discuss treatment options after we have seen the results of these tests.

## 2018-02-13 ENCOUNTER — Telehealth: Payer: Self-pay | Admitting: Pulmonary Disease

## 2018-02-13 NOTE — Telephone Encounter (Signed)
Called and spoke to pt. Pt states Dr.p Michail Sermon with [facility] performed her 24-hour pH probe study. I have contacted Dr. Kathline Magic office and requested that requests be faxed to our office.   Will hold message in triage until records are received.

## 2018-02-19 NOTE — Telephone Encounter (Signed)
Routing to myself to look in information in BQ's cubby.

## 2018-02-26 ENCOUNTER — Ambulatory Visit: Payer: Medicare Other | Admitting: Diagnostic Neuroimaging

## 2018-02-27 NOTE — Telephone Encounter (Signed)
Records have been received and in BQ's look at folder. Nothing further is needed.

## 2018-02-27 NOTE — Telephone Encounter (Signed)
Please advise if this has been taken care of. Thanks.  

## 2018-03-07 ENCOUNTER — Ambulatory Visit (INDEPENDENT_AMBULATORY_CARE_PROVIDER_SITE_OTHER)
Admission: RE | Admit: 2018-03-07 | Discharge: 2018-03-07 | Disposition: A | Discharge status: Home / Self Care | Payer: Medicare Other | Source: Ambulatory Visit | Attending: Pulmonary Disease | Admitting: Pulmonary Disease

## 2018-03-07 DIAGNOSIS — R05 Cough: Secondary | ICD-10-CM | POA: Diagnosis not present

## 2018-03-07 DIAGNOSIS — J849 Interstitial pulmonary disease, unspecified: Secondary | ICD-10-CM | POA: Diagnosis not present

## 2018-03-07 DIAGNOSIS — R059 Cough, unspecified: Secondary | ICD-10-CM

## 2018-03-12 ENCOUNTER — Ambulatory Visit (INDEPENDENT_AMBULATORY_CARE_PROVIDER_SITE_OTHER): Payer: Medicare Other | Admitting: Pulmonary Disease

## 2018-03-12 ENCOUNTER — Telehealth: Payer: Self-pay | Admitting: Pulmonary Disease

## 2018-03-12 DIAGNOSIS — R059 Cough, unspecified: Secondary | ICD-10-CM

## 2018-03-12 DIAGNOSIS — R05 Cough: Secondary | ICD-10-CM

## 2018-03-12 LAB — PULMONARY FUNCTION TEST
DL/VA % pred: 112 %
DL/VA: 5.39 ml/min/mmHg/L
DLCO COR % PRED: 74 %
DLCO UNC % PRED: 72 %
DLCO cor: 18.17 ml/min/mmHg
DLCO unc: 17.71 ml/min/mmHg
FEF 25-75 POST: 1.32 L/s
FEF 25-75 PRE: 1.27 L/s
FEF2575-%Change-Post: 4 %
FEF2575-%PRED-PRE: 74 %
FEF2575-%Pred-Post: 77 %
FEV1-%Change-Post: 2 %
FEV1-%PRED-POST: 71 %
FEV1-%Pred-Pre: 69 %
FEV1-Post: 1.53 L
FEV1-Pre: 1.49 L
FEV1FVC-%Change-Post: 6 %
FEV1FVC-%PRED-PRE: 102 %
FEV6-%CHANGE-POST: -3 %
FEV6-%Pred-Post: 69 %
FEV6-%Pred-Pre: 71 %
FEV6-Post: 1.88 L
FEV6-Pre: 1.94 L
FEV6FVC-%Change-Post: 0 %
FEV6FVC-%Pred-Post: 105 %
FEV6FVC-%Pred-Pre: 104 %
FVC-%Change-Post: -3 %
FVC-%PRED-PRE: 68 %
FVC-%Pred-Post: 65 %
FVC-POST: 1.88 L
FVC-PRE: 1.95 L
POST FEV6/FVC RATIO: 100 %
Post FEV1/FVC ratio: 81 %
Pre FEV1/FVC ratio: 77 %
Pre FEV6/FVC Ratio: 100 %
RV % pred: 85 %
RV: 1.93 L
TLC % pred: 77 %
TLC: 3.89 L

## 2018-03-12 NOTE — Telephone Encounter (Signed)
Both tests looked pretty good.  Some minor abnormalities we can discuss more on next visit but nothing to worry about.

## 2018-03-12 NOTE — Telephone Encounter (Signed)
Dr. McQuaid please advise. 

## 2018-03-12 NOTE — Telephone Encounter (Signed)
Spoke with patient and advised her that we will contact her once we have the results. Patient verbalized understanding.   Will route to BJT to follow up.

## 2018-03-12 NOTE — Progress Notes (Signed)
pft  

## 2018-03-12 NOTE — Telephone Encounter (Signed)
Called patient unable to reach left message to give us a call back.

## 2018-03-13 NOTE — Telephone Encounter (Signed)
Spoke with pt. She is aware of results. Nothing further was needed.  

## 2018-04-24 ENCOUNTER — Ambulatory Visit: Payer: Medicare Other | Admitting: Pulmonary Disease

## 2018-04-29 ENCOUNTER — Telehealth: Payer: Self-pay | Admitting: Primary Care

## 2018-04-29 ENCOUNTER — Encounter: Payer: Self-pay | Admitting: Primary Care

## 2018-04-29 ENCOUNTER — Ambulatory Visit (INDEPENDENT_AMBULATORY_CARE_PROVIDER_SITE_OTHER): Payer: Medicare Other | Admitting: Primary Care

## 2018-04-29 ENCOUNTER — Other Ambulatory Visit (INDEPENDENT_AMBULATORY_CARE_PROVIDER_SITE_OTHER): Payer: Medicare Other

## 2018-04-29 VITALS — BP 112/70 | HR 105 | Ht 64.0 in | Wt 113.0 lb

## 2018-04-29 DIAGNOSIS — R05 Cough: Secondary | ICD-10-CM

## 2018-04-29 DIAGNOSIS — R059 Cough, unspecified: Secondary | ICD-10-CM | POA: Insufficient documentation

## 2018-04-29 DIAGNOSIS — R942 Abnormal results of pulmonary function studies: Secondary | ICD-10-CM

## 2018-04-29 DIAGNOSIS — I2729 Other secondary pulmonary hypertension: Secondary | ICD-10-CM

## 2018-04-29 DIAGNOSIS — J849 Interstitial pulmonary disease, unspecified: Secondary | ICD-10-CM

## 2018-04-29 DIAGNOSIS — R053 Chronic cough: Secondary | ICD-10-CM

## 2018-04-29 LAB — HEMOGLOBIN: Hemoglobin: 12.6 g/dL (ref 12.0–15.0)

## 2018-04-29 LAB — NITRIC OXIDE: Nitric Oxide: 17

## 2018-04-29 MED ORDER — PREDNISONE 20 MG PO TABS: 20.0000 mg | ORAL_TABLET | Freq: Every day | ORAL | 0 refills | Status: DC

## 2018-04-29 MED ORDER — FLUTICASONE FUROATE-VILANTEROL 100-25 MCG/INH IN AEPB: 1.0000 | INHALATION_SPRAY | Freq: Every day | RESPIRATORY_TRACT | 0 refills | Status: DC

## 2018-04-29 NOTE — Telephone Encounter (Signed)
DLCO correlated was 18.17 (74%). Hgb 12.6. Did you want me to order an ECHO or is this ok

## 2018-04-29 NOTE — Assessment & Plan Note (Addendum)
HRCT showed mild to moderate air trapping indicative of small airway disease, chronic fibrotic changes bilaterally favored to be related to areas of chronic postinfectious or inflammatory lung scarring.  No other findings suggestive of ILD.   PFTs mild restriction, no obstruction. Lung volumes normal. Low DLCO not correlated with hgb, needs hgb today. If remains low per Dr. Kathee Delton note recommend ECHO.   Trial prednisone x 5 days and start on Breo   GERD diet, delsym BID, hard candies (no menthol)  FU in 2-4 weeks

## 2018-04-29 NOTE — Telephone Encounter (Signed)
Called and spoke with pt regarding Abigail Barrow NP recommendations of OV today. Listed below is [name]'s recommendations. Pt was making sure to take rx Prednisone 20mg  daily for five days; agreed this is correct. Nothing further needed.  [name], NP (Nurse Practitioner) at 04/29/2018 9:49 AM - Signed     Trial prednisone course 20mg  po daily x 5 days Trial daily inhaler for mild restrictive lung disease - Breo 1 puff daily Hard candies ok, no menthol  Continue delsym as needed twice a day Watch GERD diet - limit eating late at night, elevate head of bed, monitor alcohol, chocolate, spicy and citrus foods (including tomato's).  FU in 2-4 weeks with [name] or NP

## 2018-04-29 NOTE — Patient Instructions (Addendum)
Trial prednisone course 20mg  po daily x 5 days Trial daily inhaler for mild restrictive lung disease - Breo 1 puff daily Hard candies ok, no menthol  Continue delsym as needed twice a day Watch GERD diet - limit eating late at night, elevate head of bed, monitor alcohol, chocolate, spicy and citrus foods (including tomato's).  FU in 2-4 weeks with [name] or NP

## 2018-04-29 NOTE — Progress Notes (Signed)
@Patient  ID: Abigail Hawkins, female    DOB: [birth year], 72 y.o.   MRN: [id number]  Chief Complaint  Patient presents with  . Follow-up    Review PFT and CT scan results. Reports that her breathing is unchanged since last OV. Cough is still present.    Referring provider: Lawerance Cruel, MD  HPI: 72 year old female, never smoked.  Patient of [name], seen on May 14 for nonproductive cough for several years.  She has had normal allergy testing and 24-hour pH probe.  Pepcid for cough has not helped.  HRCT showed mild to moderate air trapping indicative of small airway disease, chronic fibrotic changes bilaterally favored to be related to areas of chronic postinfectious or inflammatory lung scarring.  No other findings suggestive of ILD.    PFT 03/12/18- FVC 1.95 (68%), FEV1 1.49 (69%), Ratio 77 (102%), DLCOunc 17.71 (72%)  04/29/2018 Presents today for 2 to 61-month follow-up. Continues to have dry, NP cough. Cough occurs when she is talking, laughing or taking a deep breath. It also happens when she lies down at night and moves from side to side. No sob with activity. She is able to exercise without any difficulties. States that her father smoked in her house until she left for college. HX freq sinus infections when she was younger and one lung infection not requiring hospitalization.   Allergies  Allergen Reactions  . Penicillin G Rash    Immunization History  Administered Date(s) Administered  . Influenza, High Dose Seasonal PF 07/13/2017  . Influenza-Unspecified 06/02/2014    History reviewed. No pertinent past medical history.  Tobacco History: Social History   Tobacco Use  Smoking Status Never Smoker  Smokeless Tobacco Never Used   Counseling given: Not Answered   Outpatient Medications Prior to Visit  Medication Sig Dispense Refill  . aspirin 81 MG tablet Take 81 mg by mouth daily.    . cetirizine (ZYRTEC) 10 MG tablet Take 10 mg by mouth daily.    . MULTIPLE  VITAMIN PO Take by mouth.    . rosuvastatin (CRESTOR) 20 MG tablet Take 20 mg by mouth daily.    Marland Kitchen VITAMIN D, CHOLECALCIFEROL, PO Take by mouth.     No facility-administered medications prior to visit.       Review of Systems  Review of Systems  Constitutional: Negative.   HENT: Negative.   Respiratory: Positive for cough. Negative for chest tightness, shortness of breath and wheezing.   Cardiovascular: Negative.   Gastrointestinal: Negative.      Physical Exam  BP 112/70 (BP Location: Left Arm, Cuff Size: Normal)   Pulse (!) 105   Ht 5\' 4"  (1.626 m)   Wt 113 lb (51.3 kg)   SpO2 96%   BMI 19.40 kg/m  Physical Exam  Constitutional: She is oriented to person, place, and time. She appears well-developed and well-nourished.  HENT:  Head: Normocephalic and atraumatic.  Eyes: Pupils are equal, round, and reactive to light. EOM are normal.  Neck: Normal range of motion. Neck supple.  Cardiovascular: Normal rate, regular rhythm and normal heart sounds.  No murmur heard. Pulmonary/Chest: Effort normal and breath sounds normal. No respiratory distress. She has no wheezes.  Abdominal: Soft. Bowel sounds are normal. There is no tenderness.  Neurological: She is alert and oriented to person, place, and time.  Skin: Skin is warm and dry. No rash noted. No erythema.  Psychiatric: She has a normal mood and affect. Her behavior is normal. Judgment normal.  Lab Results:  CBC No results found for: WBC, RBC, HGB, HCT, PLT, MCV, MCH, MCHC, RDW, LYMPHSABS, MONOABS, EOSABS, BASOSABS  BMET No results found for: NA, K, CL, CO2, GLUCOSE, BUN, CREATININE, CALCIUM, GFRNONAA, GFRAA  BNP No results found for: BNP  ProBNP No results found for: PROBNP  Imaging: No results found.   Assessment & Plan:   Cough HRCT showed mild to moderate air trapping indicative of small airway disease, chronic fibrotic changes bilaterally favored to be related to areas of chronic postinfectious or  inflammatory lung scarring.  No other findings suggestive of ILD.   PFTs mild restriction, no obstruction. Lung volumes normal. Low DLCO not correlated with hgb, needs hgb today. If remains low per Dr. Kathee Delton note recommend ECHO.   Trial prednisone x 5 days and start on Breo   GERD diet, delsym BID, hard candies (no menthol)  FU in 2-4 weeks      [name], NP 04/29/2018

## 2018-05-01 NOTE — Telephone Encounter (Signed)
OK to order echo if no echo within last 5 years

## 2018-05-02 NOTE — Telephone Encounter (Signed)
Please order ECHO [id number] cough, low DLCO

## 2018-05-02 NOTE — Progress Notes (Signed)
Reviewed, agree 

## 2018-05-02 NOTE — Telephone Encounter (Signed)
ECHO has been order.

## 2018-05-09 ENCOUNTER — Ambulatory Visit (HOSPITAL_COMMUNITY): Payer: Medicare Other | Attending: Cardiovascular Disease

## 2018-05-09 ENCOUNTER — Other Ambulatory Visit: Payer: Self-pay

## 2018-05-09 DIAGNOSIS — I351 Nonrheumatic aortic (valve) insufficiency: Secondary | ICD-10-CM | POA: Insufficient documentation

## 2018-05-09 DIAGNOSIS — I371 Nonrheumatic pulmonary valve insufficiency: Secondary | ICD-10-CM | POA: Diagnosis not present

## 2018-05-09 DIAGNOSIS — I2729 Other secondary pulmonary hypertension: Secondary | ICD-10-CM

## 2018-05-09 DIAGNOSIS — I509 Heart failure, unspecified: Secondary | ICD-10-CM | POA: Insufficient documentation

## 2018-05-10 ENCOUNTER — Telehealth: Payer: Self-pay | Admitting: Primary Care

## 2018-05-10 NOTE — Telephone Encounter (Signed)
Spoke with pt, she wanted to know if she had to shake the [facility] before inhalation. I explained to her that the Memory Dance is in a powder form and she does not have to shake the [facility] as she would do with an aerosol inhaler. Pt understood and stated the Memory Dance is really helping her. Nothing further is needed.

## 2018-05-24 ENCOUNTER — Telehealth: Payer: Self-pay | Admitting: Primary Care

## 2018-05-24 MED ORDER — FLUTICASONE FUROATE-VILANTEROL 100-25 MCG/INH IN AEPB: 1.0000 | INHALATION_SPRAY | Freq: Every day | RESPIRATORY_TRACT | 0 refills | Status: DC

## 2018-05-24 NOTE — Telephone Encounter (Signed)
Breo 100 prescription sent to [facility].  Left detailed message on Patient's VM.  Nothing further at this time.

## 2018-05-24 NOTE — Telephone Encounter (Signed)
OK for Rx now Breo 100

## 2018-05-24 NOTE — Telephone Encounter (Signed)
Called and spoke with Patient.  Patient was given a sample of Breo 100 at last OV 04/29/18, with Geraldo Pitter, NP.  She has 2 doses of the sample left, and she was wondering if she needed to wait until her appt with Dr Lake Bells 05/29/18 or if she could get a prescription now.  Patient stated that she is no longer  coughing and felt like the Breo worked.  per EW last OV 04/29/18- Trial prednisone x 5 days and start on Breo   Dr.McQuaid please advise

## 2018-05-29 ENCOUNTER — Ambulatory Visit (INDEPENDENT_AMBULATORY_CARE_PROVIDER_SITE_OTHER): Payer: Medicare Other | Admitting: Pulmonary Disease

## 2018-05-29 ENCOUNTER — Encounter: Payer: Self-pay | Admitting: Pulmonary Disease

## 2018-05-29 VITALS — BP 120/78 | HR 90 | Ht 63.19 in | Wt 109.6 lb

## 2018-05-29 DIAGNOSIS — J45991 Cough variant asthma: Secondary | ICD-10-CM

## 2018-05-29 MED ORDER — BECLOMETHASONE DIPROP HFA 40 MCG/ACT IN AERB: 1.0000 | INHALATION_SPRAY | Freq: Two times a day (BID) | RESPIRATORY_TRACT | 0 refills | Status: DC

## 2018-05-29 MED ORDER — BECLOMETHASONE DIPROP HFA 40 MCG/ACT IN AERB: 1.0000 | INHALATION_SPRAY | Freq: Two times a day (BID) | RESPIRATORY_TRACT | 5 refills | Status: DC

## 2018-05-29 NOTE — Patient Instructions (Signed)
Cough variant asthma: Stop Breo Start taking Qvar 1 puff twice a day Call me if the cough worsens on this medicine Get a high-dose flu shot when available Practice good hand hygiene Stay active  We will see you back in 3 months, at that point we may consider lowering the dose of Qvar further
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
## 2018-05-31 ENCOUNTER — Telehealth: Payer: Self-pay | Admitting: Pulmonary Disease

## 2018-05-31 MED ORDER — FLUTICASONE FUROATE 100 MCG/ACT IN AEPB: 1.0000 | INHALATION_SPRAY | Freq: Every day | RESPIRATORY_TRACT | 4 refills | Status: DC

## 2018-05-31 NOTE — Telephone Encounter (Signed)
Arnuity is a very acceptable substitute for QVar.  130mcg daily

## 2018-05-31 NOTE — Telephone Encounter (Signed)
Patient called stating that she was told that insurance would not cover Qvar 40 inhaler.  Called and spoke with Clair Gulling, [place] [facility] [address].  He gave the number [phone] for preferred medication.  Called and spoke with rep.  Preferred inhaler in place of Qvar is Arnuity 153mcg.  The cost of Arnuity 100 is co pay of $45 per month, or $135 for 90 days.  Called and spoke with Patient, to let her know that we have checked on alternatives and cost.  Patient stated that cost was not a problem, but she wants to be on the inhaler that [name] thinks is best.  Patient stated that she has samples of Qvar that she is currently using.  I explained that we would send a message to [name] and someone will call back.  Will route to [name]

## 2018-05-31 NOTE — Telephone Encounter (Signed)
Per BQ-    Arnuity is a very acceptable substitute for QVar.  131mcg daily    Called and spoke with Patient.  Explained [name] was ok with her changing to [place] 100, as a acceptable substitute. Patient stated understanding.  New prescription for Arnuity 100, sent to preferred pharmacy.  Nothing further at this time.

## 2018-06-02 ENCOUNTER — Other Ambulatory Visit: Payer: Self-pay | Admitting: Pulmonary Disease

## 2018-06-05 ENCOUNTER — Other Ambulatory Visit: Payer: Self-pay | Admitting: Pulmonary Disease

## 2018-06-05 MED ORDER — FLUTICASONE FUROATE 50 MCG/ACT IN AEPB: 1.0000 | INHALATION_SPRAY | Freq: Every day | RESPIRATORY_TRACT | 5 refills | Status: DC

## 2018-06-07 ENCOUNTER — Other Ambulatory Visit: Payer: Self-pay

## 2018-06-07 MED ORDER — FLUTICASONE FUROATE 50 MCG/ACT IN AEPB: 1.0000 | INHALATION_SPRAY | Freq: Every day | RESPIRATORY_TRACT | 5 refills | Status: DC

## 2018-06-18 DIAGNOSIS — Z23 Encounter for immunization: Secondary | ICD-10-CM | POA: Diagnosis not present

## 2018-06-19 ENCOUNTER — Other Ambulatory Visit: Payer: Self-pay | Admitting: Pulmonary Disease

## 2018-09-02 ENCOUNTER — Encounter: Payer: Self-pay | Admitting: Pulmonary Disease

## 2018-09-02 ENCOUNTER — Ambulatory Visit (INDEPENDENT_AMBULATORY_CARE_PROVIDER_SITE_OTHER): Payer: Medicare Other | Admitting: Pulmonary Disease

## 2018-09-02 VITALS — BP 130/78 | HR 80 | Ht 64.0 in | Wt 110.6 lb

## 2018-09-02 DIAGNOSIS — R05 Cough: Secondary | ICD-10-CM

## 2018-09-02 DIAGNOSIS — J45991 Cough variant asthma: Secondary | ICD-10-CM

## 2018-09-02 DIAGNOSIS — R053 Chronic cough: Secondary | ICD-10-CM

## 2018-09-02 MED ORDER — BUDESONIDE-FORMOTEROL FUMARATE 160-4.5 MCG/ACT IN AERO: 2.0000 | INHALATION_SPRAY | Freq: Two times a day (BID) | RESPIRATORY_TRACT | 0 refills | Status: DC

## 2018-09-02 NOTE — Patient Instructions (Signed)
Cough variant asthma: Try taking Symbicort 2 puffs as needed for your asthma symptoms (cough) Do not take Arnuity while taking this medicine Rinse your mouth after using this medicine Stay active Practice good hand hygiene I am glad that you are flu shot is up to date  Follow-up with me in 6 months or sooner if needed

## 2018-09-02 NOTE — Progress Notes (Signed)
Synopsis: Referred in May 2019 for cough, has a history of grass allergy.  Felt to have cough variant asthma based on modestly elevated exhaled NO and good response to inhaled corticosteroids.  Subjective:   PATIENT ID: Abigail Hawkins GENDER: female DOB: 1946-01-22, MRN: [id number]   HPI  Chief Complaint  Patient presents with  . Follow-up    cough,non-productive, hoarseness    Abigail Hawkins says that she has been well since the last visit.  We changed her to [place] from [place] (actually I wanted QVar but her insurance company disagreed with me).  She says that she is glad she is taking less medicine.  She still has a cough from to time.  Still has some hoarseness.  She is not having any heartburn or indigestion symptoms at all.  She is off caffeine and chocolate.  No post nasal drip or sinus congestion.  She blows her nose occasionally, but not too often.  No dyspnea or chest tightness.  She is staying active: she will walk or use the elliptical trainer 3 times per week.  She doesn't use any resistance bands.     She still has the cough from time to time, the most severe spells occur when she is talking a lot or if she bends over too much.    History reviewed. No pertinent past medical history.   History reviewed. No pertinent family history.    Review of Systems  Constitutional: Positive for weight loss. Negative for fever.  HENT: Negative for congestion, ear pain, nosebleeds and sore throat.   Eyes: Negative for redness.  Respiratory: Positive for cough. Negative for shortness of breath and wheezing.   Cardiovascular: Negative for palpitations, leg swelling and PND.  Gastrointestinal: Negative for nausea and vomiting.  Genitourinary: Positive for urgency. Negative for dysuria.  Skin: Negative for rash.  Neurological: Negative for headaches.  Endo/Heme/Allergies: Does not bruise/bleed easily.  Psychiatric/Behavioral: Negative for depression. The patient is not nervous/anxious.        Objective:  Physical Exam   Vitals:   09/02/18 1013 09/02/18 1019  BP: 130/78 130/78  Pulse:  80  SpO2:  98%  Weight: 110 lb 9.6 oz (50.2 kg) 110 lb 9.6 oz (50.2 kg)  Height: 5\' 4"  (1.626 m) 5\' 4"  (1.626 m)     Gen: well appearing HENT: OP clear, TM's clear, neck supple PULM: CTA B, normal percussion CV: RRR, no mgr, trace edema GI: BS+, soft, nontender Derm: no cyanosis or rash Psyche: normal mood and affect     CBC    Component Value Date/Time   HGB 12.6 04/29/2018 1238     Chest imaging: Images not available for me to review but May 2019 chest x-ray showed apical scarring, atelectasis and emphysematous changes  PFT: 03/12/18- FVC 1.95 (68%), FEV1 1.49 (69%), Ratio 77 (102%), DLCOunc 17.71 (72%)  Labs:  Path:  Echo: August 2019 LVEF within normal limits, no evidence of pulmonary hypertension  Heart Catheterization:  Records from her primary care doctor (Dr. Harrington Challenger) reviewed.  She was seen for cough there last week.  She noted that GERD treatment was ineffective.  She was given a prescription for Tessalon.  A chest x-ray was ordered.     Assessment & Plan:   Cough variant asthma  Chronic cough  Discussion: This has been a stable interval for Abigail Hawkins.  She has mild persistent asthma: Cough variant asthma.  We know from recent changes in guidelines and multiple clinical trials that using as needed formoterol-inhaled  corticosteroid is as effective as daily inhaled corticosteroid use.  We talked about this at length today.  She is interested in changing to this strategy to see if this will lower her total exposure to steroids on a day-to-day basis.  Plan: Cough variant asthma: Try taking Symbicort 2 puffs as needed for your asthma symptoms (cough) Do not take Arnuity while taking this medicine Rinse your mouth after using this medicine Stay active Practice good hand hygiene I am glad that you are flu shot is up to date  Follow-up with me in 6  months or sooner if needed  Greater than 50% of this 27-minute visit spent face to face    Current Outpatient Medications:  .  aspirin EC 81 MG tablet, Take 81 mg by mouth daily., Disp: , Rfl:  .  cetirizine (ZYRTEC) 10 MG tablet, Take 10 mg by mouth daily., Disp: , Rfl:  .  Fluticasone Furoate (ARNUITY ELLIPTA) 50 MCG/ACT AEPB, Inhale 1 puff into the lungs daily., Disp: 1 each, Rfl: 5 .  MULTIPLE VITAMIN PO, Take by mouth., Disp: , Rfl:  .  rosuvastatin (CRESTOR) 20 MG tablet, Take 20 mg by mouth daily., Disp: , Rfl:  .  VITAMIN D, CHOLECALCIFEROL, PO, Take by mouth., Disp: , Rfl:  .  budesonide-formoterol (SYMBICORT) 160-4.5 MCG/ACT inhaler, Inhale 2 puffs into the lungs 2 (two) times daily., Disp: 1 Inhaler, Rfl: 0

## 2018-09-03 ENCOUNTER — Other Ambulatory Visit: Payer: Self-pay | Admitting: Family Medicine

## 2018-09-03 DIAGNOSIS — Z1231 Encounter for screening mammogram for malignant neoplasm of breast: Secondary | ICD-10-CM

## 2018-09-05 DIAGNOSIS — E7800 Pure hypercholesterolemia, unspecified: Secondary | ICD-10-CM | POA: Diagnosis not present

## 2018-09-05 DIAGNOSIS — Z79899 Other long term (current) drug therapy: Secondary | ICD-10-CM | POA: Diagnosis not present

## 2018-09-11 DIAGNOSIS — Z0000 Encounter for general adult medical examination without abnormal findings: Secondary | ICD-10-CM | POA: Diagnosis not present

## 2018-09-11 DIAGNOSIS — R490 Dysphonia: Secondary | ICD-10-CM | POA: Diagnosis not present

## 2018-09-11 DIAGNOSIS — M8580 Other specified disorders of bone density and structure, unspecified site: Secondary | ICD-10-CM | POA: Diagnosis not present

## 2018-09-11 DIAGNOSIS — E559 Vitamin D deficiency, unspecified: Secondary | ICD-10-CM | POA: Diagnosis not present

## 2018-09-11 DIAGNOSIS — Z79899 Other long term (current) drug therapy: Secondary | ICD-10-CM | POA: Diagnosis not present

## 2018-09-11 DIAGNOSIS — Z1159 Encounter for screening for other viral diseases: Secondary | ICD-10-CM | POA: Diagnosis not present

## 2018-09-11 DIAGNOSIS — Z1389 Encounter for screening for other disorder: Secondary | ICD-10-CM | POA: Diagnosis not present

## 2018-09-11 DIAGNOSIS — R1310 Dysphagia, unspecified: Secondary | ICD-10-CM | POA: Diagnosis not present

## 2018-09-11 DIAGNOSIS — E7800 Pure hypercholesterolemia, unspecified: Secondary | ICD-10-CM | POA: Diagnosis not present

## 2018-09-12 ENCOUNTER — Other Ambulatory Visit: Payer: Self-pay | Admitting: Family Medicine

## 2018-09-12 DIAGNOSIS — M8580 Other specified disorders of bone density and structure, unspecified site: Secondary | ICD-10-CM

## 2018-09-12 DIAGNOSIS — M859 Disorder of bone density and structure, unspecified: Secondary | ICD-10-CM

## 2018-09-30 DIAGNOSIS — L959 Vasculitis limited to the skin, unspecified: Secondary | ICD-10-CM | POA: Diagnosis not present

## 2018-09-30 DIAGNOSIS — L958 Other vasculitis limited to the skin: Secondary | ICD-10-CM | POA: Diagnosis not present

## 2018-09-30 DIAGNOSIS — R233 Spontaneous ecchymoses: Secondary | ICD-10-CM | POA: Diagnosis not present

## 2018-10-07 DIAGNOSIS — R21 Rash and other nonspecific skin eruption: Secondary | ICD-10-CM | POA: Diagnosis not present

## 2018-10-11 ENCOUNTER — Ambulatory Visit
Admission: RE | Admit: 2018-10-11 | Discharge: 2018-10-11 | Disposition: A | Discharge status: Home / Self Care | Payer: Medicare Other | Source: Ambulatory Visit | Attending: Family Medicine | Admitting: Family Medicine

## 2018-10-11 DIAGNOSIS — Z1231 Encounter for screening mammogram for malignant neoplasm of breast: Secondary | ICD-10-CM

## 2018-10-17 ENCOUNTER — Other Ambulatory Visit: Payer: Self-pay | Admitting: Gastroenterology

## 2018-10-17 DIAGNOSIS — D2230 Melanocytic nevi of unspecified part of face: Secondary | ICD-10-CM | POA: Diagnosis not present

## 2018-10-17 DIAGNOSIS — R1310 Dysphagia, unspecified: Secondary | ICD-10-CM | POA: Diagnosis not present

## 2018-10-17 DIAGNOSIS — I781 Nevus, non-neoplastic: Secondary | ICD-10-CM | POA: Diagnosis not present

## 2018-10-17 DIAGNOSIS — R490 Dysphonia: Secondary | ICD-10-CM | POA: Diagnosis not present

## 2018-10-17 DIAGNOSIS — Z23 Encounter for immunization: Secondary | ICD-10-CM | POA: Diagnosis not present

## 2018-10-17 DIAGNOSIS — D2271 Melanocytic nevi of right lower limb, including hip: Secondary | ICD-10-CM | POA: Diagnosis not present

## 2018-10-17 DIAGNOSIS — L821 Other seborrheic keratosis: Secondary | ICD-10-CM | POA: Diagnosis not present

## 2018-10-17 DIAGNOSIS — L219 Seborrheic dermatitis, unspecified: Secondary | ICD-10-CM | POA: Diagnosis not present

## 2018-10-30 ENCOUNTER — Encounter (HOSPITAL_COMMUNITY)
Admission: RE | Disposition: A | Discharge status: Home / Self Care | Payer: Self-pay | Source: Home / Self Care | Attending: Gastroenterology

## 2018-10-30 ENCOUNTER — Ambulatory Visit (HOSPITAL_COMMUNITY)
Admission: RE | Admit: 2018-10-30 | Discharge: 2018-10-30 | Disposition: A | Discharge status: Home / Self Care | Payer: Medicare Other | Attending: Gastroenterology | Admitting: Gastroenterology

## 2018-10-30 DIAGNOSIS — K222 Esophageal obstruction: Secondary | ICD-10-CM | POA: Insufficient documentation

## 2018-10-30 DIAGNOSIS — R490 Dysphonia: Secondary | ICD-10-CM | POA: Diagnosis not present

## 2018-10-30 DIAGNOSIS — R1310 Dysphagia, unspecified: Secondary | ICD-10-CM | POA: Diagnosis present

## 2018-10-30 HISTORY — PX: PH IMPEDANCE STUDY: SHX5565

## 2018-10-30 HISTORY — PX: ESOPHAGEAL MANOMETRY: SHX5429

## 2018-10-30 SURGERY — MANOMETRY, ESOPHAGUS

## 2018-10-30 MED ORDER — LIDOCAINE VISCOUS HCL 2 % MT SOLN
OROMUCOSAL | Status: AC
  Filled 2018-10-30: qty 15

## 2018-10-30 SURGICAL SUPPLY — 2 items
FACESHIELD LNG OPTICON STERILE (SAFETY) IMPLANT
GLOVE BIO SURGEON STRL SZ8 (GLOVE) ×4 IMPLANT

## 2018-10-30 NOTE — Progress Notes (Signed)
Esophageal manometry performed per protocol.  Patient tolerated procedure without complications.  PH probe placed per protocol at 35cm.  Patient tolerated procedure without complications.  Instructions given.  Patient verbalized understanding.  Patient to return tomorrow around 0906 to have pH probe removed.  Dr. Michail Sermon to interpret results.

## 2018-10-31 ENCOUNTER — Encounter (HOSPITAL_COMMUNITY): Payer: Self-pay | Admitting: Gastroenterology

## 2018-11-04 DIAGNOSIS — R490 Dysphonia: Secondary | ICD-10-CM | POA: Diagnosis not present

## 2018-11-04 DIAGNOSIS — R1310 Dysphagia, unspecified: Secondary | ICD-10-CM | POA: Diagnosis not present

## 2018-11-19 DIAGNOSIS — R1310 Dysphagia, unspecified: Secondary | ICD-10-CM | POA: Diagnosis not present

## 2018-11-25 ENCOUNTER — Other Ambulatory Visit: Payer: Medicare Other

## 2018-12-23 ENCOUNTER — Other Ambulatory Visit: Payer: Medicare Other

## 2019-01-15 ENCOUNTER — Ambulatory Visit: Payer: Medicare Other | Admitting: Pulmonary Disease

## 2019-02-19 ENCOUNTER — Other Ambulatory Visit: Payer: Medicare Other

## 2019-02-28 ENCOUNTER — Ambulatory Visit: Payer: Medicare Other | Admitting: Pulmonary Disease

## 2019-04-14 ENCOUNTER — Other Ambulatory Visit: Payer: Medicare Other

## 2019-06-19 DIAGNOSIS — H43811 Vitreous degeneration, right eye: Secondary | ICD-10-CM | POA: Diagnosis not present

## 2019-06-19 DIAGNOSIS — H2511 Age-related nuclear cataract, right eye: Secondary | ICD-10-CM | POA: Diagnosis not present

## 2019-06-19 DIAGNOSIS — Q120 Congenital cataract: Secondary | ICD-10-CM | POA: Diagnosis not present

## 2019-06-24 ENCOUNTER — Ambulatory Visit
Admission: RE | Admit: 2019-06-24 | Discharge: 2019-06-24 | Disposition: A | Discharge status: Home / Self Care | Payer: Medicare Other | Source: Ambulatory Visit | Attending: Family Medicine | Admitting: Family Medicine

## 2019-06-24 ENCOUNTER — Other Ambulatory Visit: Payer: Self-pay

## 2019-06-24 DIAGNOSIS — M85852 Other specified disorders of bone density and structure, left thigh: Secondary | ICD-10-CM | POA: Diagnosis not present

## 2019-06-24 DIAGNOSIS — M810 Age-related osteoporosis without current pathological fracture: Secondary | ICD-10-CM | POA: Diagnosis not present

## 2019-06-24 DIAGNOSIS — M8580 Other specified disorders of bone density and structure, unspecified site: Secondary | ICD-10-CM

## 2019-06-24 DIAGNOSIS — Z780 Asymptomatic menopausal state: Secondary | ICD-10-CM | POA: Diagnosis not present

## 2019-06-24 DIAGNOSIS — M859 Disorder of bone density and structure, unspecified: Secondary | ICD-10-CM

## 2019-06-25 ENCOUNTER — Other Ambulatory Visit: Payer: Medicare Other

## 2019-07-01 DIAGNOSIS — M810 Age-related osteoporosis without current pathological fracture: Secondary | ICD-10-CM | POA: Diagnosis not present

## 2019-07-08 DIAGNOSIS — Z23 Encounter for immunization: Secondary | ICD-10-CM | POA: Diagnosis not present

## 2019-07-16 ENCOUNTER — Emergency Department (HOSPITAL_COMMUNITY): Payer: Medicare Other

## 2019-07-16 ENCOUNTER — Emergency Department (HOSPITAL_COMMUNITY)
Admission: EM | Admit: 2019-07-16 | Discharge: 2019-07-16 | Disposition: A | Discharge status: Home / Self Care | Payer: Medicare Other | Attending: Emergency Medicine | Admitting: Emergency Medicine

## 2019-07-16 ENCOUNTER — Other Ambulatory Visit: Payer: Self-pay

## 2019-07-16 ENCOUNTER — Encounter (HOSPITAL_COMMUNITY): Payer: Self-pay | Admitting: Obstetrics and Gynecology

## 2019-07-16 DIAGNOSIS — Z79899 Other long term (current) drug therapy: Secondary | ICD-10-CM | POA: Insufficient documentation

## 2019-07-16 DIAGNOSIS — W19XXXA Unspecified fall, initial encounter: Secondary | ICD-10-CM

## 2019-07-16 DIAGNOSIS — W1789XA Other fall from one level to another, initial encounter: Secondary | ICD-10-CM | POA: Diagnosis not present

## 2019-07-16 DIAGNOSIS — Y999 Unspecified external cause status: Secondary | ICD-10-CM | POA: Diagnosis not present

## 2019-07-16 DIAGNOSIS — S3992XA Unspecified injury of lower back, initial encounter: Secondary | ICD-10-CM | POA: Diagnosis not present

## 2019-07-16 DIAGNOSIS — Z7982 Long term (current) use of aspirin: Secondary | ICD-10-CM | POA: Insufficient documentation

## 2019-07-16 DIAGNOSIS — M533 Sacrococcygeal disorders, not elsewhere classified: Secondary | ICD-10-CM | POA: Diagnosis not present

## 2019-07-16 DIAGNOSIS — S0990XA Unspecified injury of head, initial encounter: Secondary | ICD-10-CM | POA: Diagnosis not present

## 2019-07-16 DIAGNOSIS — M545 Low back pain: Secondary | ICD-10-CM | POA: Insufficient documentation

## 2019-07-16 DIAGNOSIS — R519 Headache, unspecified: Secondary | ICD-10-CM | POA: Diagnosis not present

## 2019-07-16 DIAGNOSIS — Y9389 Activity, other specified: Secondary | ICD-10-CM | POA: Insufficient documentation

## 2019-07-16 DIAGNOSIS — R42 Dizziness and giddiness: Secondary | ICD-10-CM | POA: Insufficient documentation

## 2019-07-16 DIAGNOSIS — Y92009 Unspecified place in unspecified non-institutional (private) residence as the place of occurrence of the external cause: Secondary | ICD-10-CM | POA: Insufficient documentation

## 2019-07-16 DIAGNOSIS — S199XXA Unspecified injury of neck, initial encounter: Secondary | ICD-10-CM | POA: Diagnosis not present

## 2019-07-16 NOTE — ED Provider Notes (Signed)
[facility] DEPT Provider Note   CSN: [id number] Arrival date & time: 07/16/19  1556     History   Chief Complaint Chief Complaint  Patient presents with   Fall   Headache    HPI Abigail Hawkins is a 73 y.o. female without significant past medical hx who presents to the ED for evaluation s/p fall 1 hour PTA. Patient states she was on her countertop trying to fix a window when she lost her grip and fell backwards onto the floor on her bottom then fell back further and struck the back of her head. No LOC. States she felt a bit dizzy almost like she had had some alcohol for a little immediately after the fall but this has resolved. She now just feels she has to walk slower to be careful but has been ambulatory. She states her head does not particularly hurt. She does have some soreness/pain to the sacral area. No alleviating/aggravating factors.  Denies nausea, vomiting, LOC, seizure activity, numbness, weakness, or incontinence.  Patient states that she has a left eye congenital cataract that causes her pupil to be larger in that eye compared to the right and also causes visual abnormalities, no acute change.     HPI  History reviewed. No pertinent past medical history.  Patient Active Problem List   Diagnosis Date Noted   Cough 04/29/2018    Past Surgical History:  Procedure Laterality Date   ESOPHAGEAL MANOMETRY N/A 10/30/2018   Procedure: ESOPHAGEAL MANOMETRY (EM);  Surgeon: Wilford Corner, MD;  Location: [facility] ENDOSCOPY;  Service: Endoscopy;  Laterality: N/A;   [place] IMPEDANCE STUDY N/A 10/30/2018   Procedure: [place] IMPEDANCE STUDY;  Surgeon: Wilford Corner, MD;  Location: [facility] ENDOSCOPY;  Service: Endoscopy;  Laterality: N/A;     OB History    Gravida      Para      Term      Preterm      AB      Living  2     SAB      TAB      Ectopic      Multiple      Live Births               Home Medications    Prior to  Admission medications   Medication Sig Start Date End Date Taking? Authorizing Provider  aspirin EC 81 MG tablet Take 81 mg by mouth daily.    [provider]  budesonide-formoterol (SYMBICORT) 160-4.5 MCG/ACT inhaler Inhale 2 puffs into the lungs 2 (two) times daily. 09/02/18   Juanito Doom, MD  cetirizine (ZYRTEC) 10 MG tablet Take 10 mg by mouth daily.    [provider]  Fluticasone Furoate (ARNUITY ELLIPTA) 50 MCG/ACT AEPB Inhale 1 puff into the lungs daily. 06/07/18   Juanito Doom, MD  MULTIPLE VITAMIN PO Take by mouth.    [provider]  rosuvastatin (CRESTOR) 20 MG tablet Take 20 mg by mouth daily.    [provider]  VITAMIN D, CHOLECALCIFEROL, PO Take by mouth.    [provider]    Family History Family History  Problem Relation Age of Onset   Breast cancer Neg Hx     Social History Social History   Tobacco Use   Smoking status: Never Smoker   Smokeless tobacco: Never Used  Substance Use Topics   Alcohol use: No   Drug use: No     Allergies  Penicillin g   Review of Systems Review of Systems  Constitutional: Negative for chills and fever.  Eyes: Negative for visual disturbance.  Respiratory: Negative for shortness of breath.   Cardiovascular: Negative for chest pain.  Gastrointestinal: Negative for abdominal pain, nausea and vomiting.  Musculoskeletal: Positive for back pain.  Neurological: Positive for dizziness ("felt intoxicated" briefly after fall, resolved now). Negative for weakness, numbness and headaches.       Negative for incontinence.   All other systems reviewed and are negative.    Physical Exam Updated Vital Signs BP (!) 179/86 (BP Location: Right Arm)    Pulse 90    Temp 98.3 F (36.8 C) (Oral)    Resp 18    SpO2 96%   Physical Exam Vitals signs and nursing note reviewed.  Constitutional:      General: She is not in acute distress.    Appearance: She is well-developed.    HENT:     Head: Normocephalic and atraumatic. No raccoon eyes or Battle's sign.     Right Ear: No hemotympanum.     Left Ear: No hemotympanum.  Eyes:     General:        Right eye: No discharge.        Left eye: No discharge.     Extraocular Movements: Extraocular movements intact.     Conjunctiva/sclera: Conjunctivae normal.     Comments: Pupils asymmetric L>R which patient states is baseline- each are reactive.   Neck:     Musculoskeletal: Normal range of motion. Spinous process tenderness (diffuse no point/focal) present.  Cardiovascular:     Rate and Rhythm: Normal rate and regular rhythm.     Heart sounds: No murmur.  Pulmonary:     Effort: No respiratory distress.     Breath sounds: Normal breath sounds. No wheezing or rales.  Chest:     Chest wall: No tenderness.  Abdominal:     General: There is no distension.     Palpations: Abdomen is soft.     Tenderness: There is no abdominal tenderness.  Musculoskeletal:     Comments: No obvious deformity, appreciable swelling, erythema, ecchymosis, or open wounds.  Intact active range of motion to bilateral upper and lower extremities without point/focal bony tenderness.  No thoracic or lumbar spine tenderness.  Patient is tender to palpation to the right sacral region.  No palpable step-off.  Skin:    General: Skin is warm and dry.     Findings: No rash.  Neurological:     Comments: Alert.  Clear speech.  CN III through XII grossly intact.  Sensation grossly intact bilateral upper and lower extremities.  5 out of 5 symmetric grip strength.  5 out of 5 strength with plantar and dorsiflexion bilaterally.  Patient is ambulatory.  Psychiatric:        Behavior: Behavior normal.      ED Treatments / Results  Labs (all labs ordered are listed, but only abnormal results are displayed) Labs Reviewed - No data to display  EKG None  Radiology Dg Sacrum/coccyx  Result Date: 07/16/2019 CLINICAL DATA:  Pt reports she had crawled  onto her counter to open a window and fell off the counter and hit the floor with rear end and her head on the tile. Patient reports she did not pass out, but that her vision got fuzzy after - R>L sacral pain EXAM: SACRUM AND COCCYX - 2+ VIEW COMPARISON:  None. FINDINGS: There is no evidence of fracture  or other focal bone lesions. IMPRESSION: Negative. Electronically Signed   By: Lajean Manes M.D.   On: 07/16/2019 17:29   Ct Head Wo Contrast  Result Date: 07/16/2019 CLINICAL DATA:  Fall from counter with head injury. Headache. EXAM: CT HEAD WITHOUT CONTRAST CT CERVICAL SPINE WITHOUT CONTRAST TECHNIQUE: Multidetector CT imaging of the head and cervical spine was performed following the standard protocol without intravenous contrast. Multiplanar CT image reconstructions of the cervical spine were also generated. COMPARISON:  Brain and cervical spine MRI 10/26/2012. FINDINGS: CT HEAD FINDINGS Brain: No evidence of parenchymal hemorrhage or extra-axial fluid collection. No mass lesion, mass effect, or midline shift. No CT evidence of acute infarction. Nonspecific mild subcortical and periventricular white matter hypodensity, most in keeping with chronic small vessel ischemic change. Cerebral volume is age appropriate. No ventriculomegaly. Vascular: No acute abnormality. Skull: No evidence of calvarial fracture. Sinuses/Orbits: The visualized paranasal sinuses are essentially clear. Other:  The mastoid air cells are unopacified. CT CERVICAL SPINE FINDINGS Alignment: Normal cervical lordosis. No facet subluxation. Dens is well positioned between the lateral masses of C1. Minimal 2 mm anterolisthesis at C3-4. Skull base and vertebrae: No acute fracture. No primary bone lesion or focal pathologic process. Soft tissues and spinal canal: No prevertebral edema. No visible canal hematoma. Disc levels: Moderate to marked multilevel degenerative disc disease in the cervical spine, most prominent at C5-6. Moderate bilateral  facet arthropathy. Moderate degenerate foraminal stenosis on the left at C3-4. Mild degenerative foraminal stenosis on the right at C5-6. Upper chest: No acute abnormality. Biapical pleural-parenchymal scarring with mild bronchiectasis. Other: Visualized mastoid air cells appear clear. No discrete thyroid nodules. No pathologically enlarged cervical nodes. IMPRESSION: 1. No evidence of acute intracranial abnormality. No evidence of calvarial fracture. 2. Mild chronic small vessel ischemic changes in the cerebral white matter. 3. No cervical spine fracture or facet subluxation. 4. Moderate to marked multilevel degenerative changes in the cervical spine as detailed. Electronically Signed   By: Ilona Sorrel M.D.   On: 07/16/2019 19:34   Ct Cervical Spine Wo Contrast  Result Date: 07/16/2019 CLINICAL DATA:  Fall from counter with head injury. Headache. EXAM: CT HEAD WITHOUT CONTRAST CT CERVICAL SPINE WITHOUT CONTRAST TECHNIQUE: Multidetector CT imaging of the head and cervical spine was performed following the standard protocol without intravenous contrast. Multiplanar CT image reconstructions of the cervical spine were also generated. COMPARISON:  Brain and cervical spine MRI 10/26/2012. FINDINGS: CT HEAD FINDINGS Brain: No evidence of parenchymal hemorrhage or extra-axial fluid collection. No mass lesion, mass effect, or midline shift. No CT evidence of acute infarction. Nonspecific mild subcortical and periventricular white matter hypodensity, most in keeping with chronic small vessel ischemic change. Cerebral volume is age appropriate. No ventriculomegaly. Vascular: No acute abnormality. Skull: No evidence of calvarial fracture. Sinuses/Orbits: The visualized paranasal sinuses are essentially clear. Other:  The mastoid air cells are unopacified. CT CERVICAL SPINE FINDINGS Alignment: Normal cervical lordosis. No facet subluxation. Dens is well positioned between the lateral masses of C1. Minimal 2 mm  anterolisthesis at C3-4. Skull base and vertebrae: No acute fracture. No primary bone lesion or focal pathologic process. Soft tissues and spinal canal: No prevertebral edema. No visible canal hematoma. Disc levels: Moderate to marked multilevel degenerative disc disease in the cervical spine, most prominent at C5-6. Moderate bilateral facet arthropathy. Moderate degenerate foraminal stenosis on the left at C3-4. Mild degenerative foraminal stenosis on the right at C5-6. Upper chest: No acute abnormality. Biapical pleural-parenchymal scarring with mild bronchiectasis.  Other: Visualized mastoid air cells appear clear. No discrete thyroid nodules. No pathologically enlarged cervical nodes. IMPRESSION: 1. No evidence of acute intracranial abnormality. No evidence of calvarial fracture. 2. Mild chronic small vessel ischemic changes in the cerebral white matter. 3. No cervical spine fracture or facet subluxation. 4. Moderate to marked multilevel degenerative changes in the cervical spine as detailed. Electronically Signed   By: Ilona Sorrel M.D.   On: 07/16/2019 19:34    Procedures Procedures (including critical care time)  Medications Ordered in ED Medications - No data to display   Initial Impression / Assessment and Plan / ED Course  I have reviewed the triage vital signs and the nursing notes.  Pertinent labs & imaging results that were available during my care of the patient were reviewed by me and considered in my medical decision making (see chart for details).   Patient presents to the ED s/p mechanical fall with head injury currently with mild pain in the R sacral region. Nontoxic appearing, no apparent distress, vitals WNL with the exception of elevated BP- doubt HTN emergency. CT head/Cspine w/o traumatic injury- chronic changes/degenerative changes as above. No thoracic/lumbar tenderness. Sacral/coccygeal xray negative for fx/dislocation.  No point/focal vertebral tenderness, no acute focal  neuro deficits- doubt head bleed or acute spinal fx/dislocation. Chest/abdomen are nontender. Patient ambulatory. Appears appropriate for discharge. Recommended tylenol PRN pain. I discussed results, treatment plan, need for follow-up, and return precautions with the patient. Provided opportunity for questions, patient confirmed understanding and is in agreement with plan.   Findings and plan of care discussed with supervising physician Dr. Regenia Skeeter who has evaluated patient & is in agreement.   Final Clinical Impressions(s) / ED Diagnoses   Final diagnoses:  Fall, initial encounter    ED Discharge Orders    None       Amaryllis Dyke, PA-C 07/16/19 Doreen Salvage, MD 07/17/19 [phone]

## 2019-07-16 NOTE — Discharge Instructions (Addendum)
You were seen in the emergency department after a fall.  Your imaging did not show any traumatic injuries.  Your CT scan of your head and neck did show some degenerative changes but nothing new or different related to the fall.  You were seen in the emergency department today following a head injury.  We suspect that you have a degree of a concussion, otherwise known and as a mild traumatic brain injury.  We would like you to follow-up with your primary care or the [facility] concussion clinic, contact information below:  Address: [address] [place], [place] [address] Phone: [phone]  Per [facility] Website:   What to Expect: Evaluations at the [facility] All patients at the [facility] are given an extensive three-part evaluation that includes: a computerized test to measure memory, visual processing speed, and reaction time a test that measures the systems that integrate movement, balance, and vision an in-depth review of a detailed symptoms checklist for signs of concussion The evaluation process is critical for the treatment and recovery of a concussed patient as no two concussions are alike. Thankfully, the diagnostic tools that trained professionals use can help to better manage head injuries.  Part of the technology the doctors and staff at [facility] use in their assessments is a computerized examination called ImPACT. This tool uses six tasks to measure memory, visual processing speed, and reaction time. By analyzing the results of the examination and comparing them to average responses or a baseline score for a patient, our staff can make an informed judgment about the patients cognitive functions. In addition to the ImPACT examination, patients are given a Vestibular Ocular Motor Screening test. This is a simple and painless test that focuses on the systems that integrate a patients movement, balance, and  vision.  These tests are used in conjunction with a thorough review of a detailed symptoms checklist to complete the patients evaluation and develop a treatment plan.  You do not need a referral, and you can book an appointment online. Our Concussion Hotline is staffed by trained professionals during our regular office hours: Monday - Thursday from 7:30 AM to 4:30 PM, and Fridays from 7:30 AM to 12:00 PM, and the number to call is [phone]. The Concussion Clinic team meets with patients at our [facility] office. Call today.   Further ED Instructions:   Please call and follow-up within the concussion clinic as well as your primary care provider within the next 3 to 5 days.  In the meantime we would like you to avoid strenuous/over exertional activities such as sports or running.  Please avoid excess screen time utilizing cell phones, computers, or the TV.  Please avoid activities that require significant amount of concentration.  Please try to rest as much as possible.  Please take Tylenol per over-the-counter dosing instructions for any continued discomfort.  Return to the ER for new or worsening symptoms including but not limited to worsening pain, numbness, weakness, vomiting, change in vision, passing out, or any other concerns that you may have.

## 2019-07-16 NOTE — ED Notes (Signed)
Pt refused discharge vital signs

## 2019-07-16 NOTE — ED Triage Notes (Signed)
Pt reports she had crawled onto her counter to open a window and fell off the counter and hit the floor with her head on the tile. Patient reports she did not pass out, but that her vision got fuzzy after. Patient reports no bloodthinners, except a daily 81mg  aspirin.

## 2019-07-23 DIAGNOSIS — S0990XD Unspecified injury of head, subsequent encounter: Secondary | ICD-10-CM | POA: Diagnosis not present

## 2019-07-23 DIAGNOSIS — W19XXXA Unspecified fall, initial encounter: Secondary | ICD-10-CM | POA: Diagnosis not present

## 2019-07-23 DIAGNOSIS — Z09 Encounter for follow-up examination after completed treatment for conditions other than malignant neoplasm: Secondary | ICD-10-CM | POA: Diagnosis not present

## 2019-09-09 DIAGNOSIS — Z1159 Encounter for screening for other viral diseases: Secondary | ICD-10-CM | POA: Diagnosis not present

## 2019-09-09 DIAGNOSIS — Z79899 Other long term (current) drug therapy: Secondary | ICD-10-CM | POA: Diagnosis not present

## 2019-09-09 DIAGNOSIS — L959 Vasculitis limited to the skin, unspecified: Secondary | ICD-10-CM | POA: Diagnosis not present

## 2019-09-09 DIAGNOSIS — E7800 Pure hypercholesterolemia, unspecified: Secondary | ICD-10-CM | POA: Diagnosis not present

## 2019-09-09 DIAGNOSIS — E559 Vitamin D deficiency, unspecified: Secondary | ICD-10-CM | POA: Diagnosis not present

## 2019-09-09 DIAGNOSIS — R1310 Dysphagia, unspecified: Secondary | ICD-10-CM | POA: Diagnosis not present

## 2019-09-09 DIAGNOSIS — R490 Dysphonia: Secondary | ICD-10-CM | POA: Diagnosis not present

## 2019-09-09 DIAGNOSIS — M8580 Other specified disorders of bone density and structure, unspecified site: Secondary | ICD-10-CM | POA: Diagnosis not present

## 2019-11-10 ENCOUNTER — Other Ambulatory Visit: Payer: Self-pay | Admitting: Family Medicine

## 2019-11-10 DIAGNOSIS — Z1231 Encounter for screening mammogram for malignant neoplasm of breast: Secondary | ICD-10-CM

## 2019-11-27 DIAGNOSIS — D2271 Melanocytic nevi of right lower limb, including hip: Secondary | ICD-10-CM | POA: Diagnosis not present

## 2019-11-27 DIAGNOSIS — D2262 Melanocytic nevi of left upper limb, including shoulder: Secondary | ICD-10-CM | POA: Diagnosis not present

## 2019-11-27 DIAGNOSIS — M810 Age-related osteoporosis without current pathological fracture: Secondary | ICD-10-CM | POA: Diagnosis not present

## 2019-11-27 DIAGNOSIS — L219 Seborrheic dermatitis, unspecified: Secondary | ICD-10-CM | POA: Diagnosis not present

## 2019-11-27 DIAGNOSIS — Z85828 Personal history of other malignant neoplasm of skin: Secondary | ICD-10-CM | POA: Diagnosis not present

## 2019-11-27 DIAGNOSIS — L578 Other skin changes due to chronic exposure to nonionizing radiation: Secondary | ICD-10-CM | POA: Diagnosis not present

## 2019-11-27 DIAGNOSIS — C44519 Basal cell carcinoma of skin of other part of trunk: Secondary | ICD-10-CM | POA: Diagnosis not present

## 2019-11-27 DIAGNOSIS — D485 Neoplasm of uncertain behavior of skin: Secondary | ICD-10-CM | POA: Diagnosis not present

## 2019-11-27 DIAGNOSIS — D1801 Hemangioma of skin and subcutaneous tissue: Secondary | ICD-10-CM | POA: Diagnosis not present

## 2019-11-27 DIAGNOSIS — L821 Other seborrheic keratosis: Secondary | ICD-10-CM | POA: Diagnosis not present

## 2019-12-10 DIAGNOSIS — C44519 Basal cell carcinoma of skin of other part of trunk: Secondary | ICD-10-CM | POA: Diagnosis not present

## 2019-12-18 ENCOUNTER — Other Ambulatory Visit: Payer: Self-pay

## 2019-12-18 ENCOUNTER — Ambulatory Visit
Admission: RE | Admit: 2019-12-18 | Discharge: 2019-12-18 | Disposition: A | Discharge status: Home / Self Care | Payer: Medicare Other | Source: Ambulatory Visit | Attending: Family Medicine | Admitting: Family Medicine

## 2019-12-18 DIAGNOSIS — Z1231 Encounter for screening mammogram for malignant neoplasm of breast: Secondary | ICD-10-CM

## 2019-12-24 DIAGNOSIS — M810 Age-related osteoporosis without current pathological fracture: Secondary | ICD-10-CM | POA: Diagnosis not present

## 2019-12-24 DIAGNOSIS — E559 Vitamin D deficiency, unspecified: Secondary | ICD-10-CM | POA: Diagnosis not present

## 2019-12-29 DIAGNOSIS — H2511 Age-related nuclear cataract, right eye: Secondary | ICD-10-CM | POA: Diagnosis not present

## 2019-12-29 DIAGNOSIS — H04123 Dry eye syndrome of bilateral lacrimal glands: Secondary | ICD-10-CM | POA: Diagnosis not present

## 2019-12-29 DIAGNOSIS — Q120 Congenital cataract: Secondary | ICD-10-CM | POA: Diagnosis not present

## 2019-12-29 DIAGNOSIS — H43811 Vitreous degeneration, right eye: Secondary | ICD-10-CM | POA: Diagnosis not present

## 2019-12-29 DIAGNOSIS — H1045 Other chronic allergic conjunctivitis: Secondary | ICD-10-CM | POA: Diagnosis not present

## 2020-03-03 DIAGNOSIS — T304 Corrosion of unspecified body region, unspecified degree: Secondary | ICD-10-CM | POA: Diagnosis not present

## 2020-05-03 DIAGNOSIS — R5383 Other fatigue: Secondary | ICD-10-CM | POA: Diagnosis not present

## 2020-05-03 DIAGNOSIS — E559 Vitamin D deficiency, unspecified: Secondary | ICD-10-CM | POA: Diagnosis not present

## 2020-05-03 DIAGNOSIS — M810 Age-related osteoporosis without current pathological fracture: Secondary | ICD-10-CM | POA: Diagnosis not present

## 2020-06-03 IMAGING — MG DIGITAL SCREENING BILATERAL MAMMOGRAM WITH TOMO AND CAD
8 series · 9 of 24 positions shown · non-contrast
Comparison: Previous exam(s).

CLINICAL DATA: Screening.

EXAM:
DIGITAL SCREENING BILATERAL MAMMOGRAM WITH TOMO AND CAD

[R MLO synth-2D]
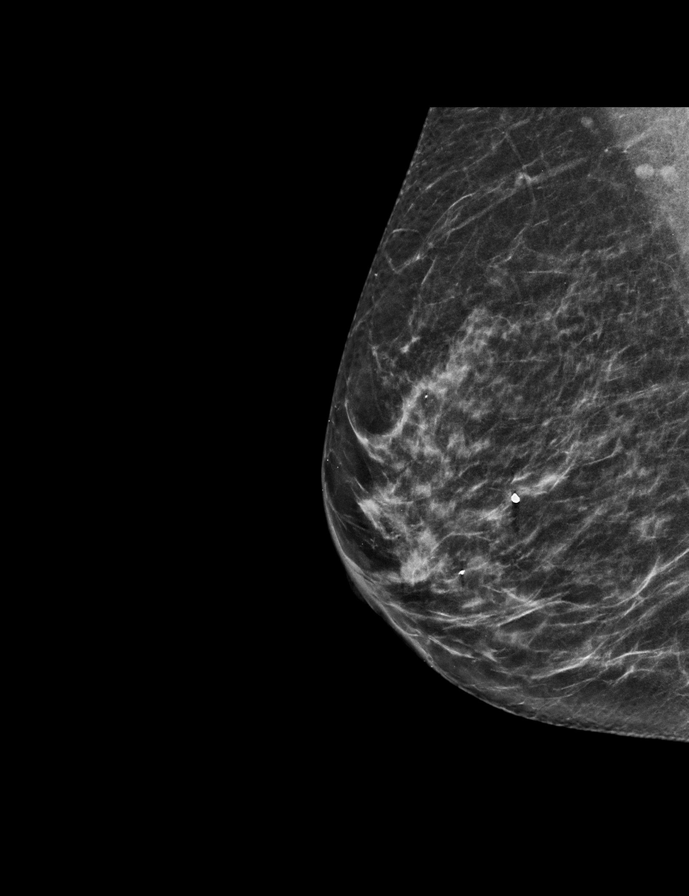

[L MLO synth-2D]
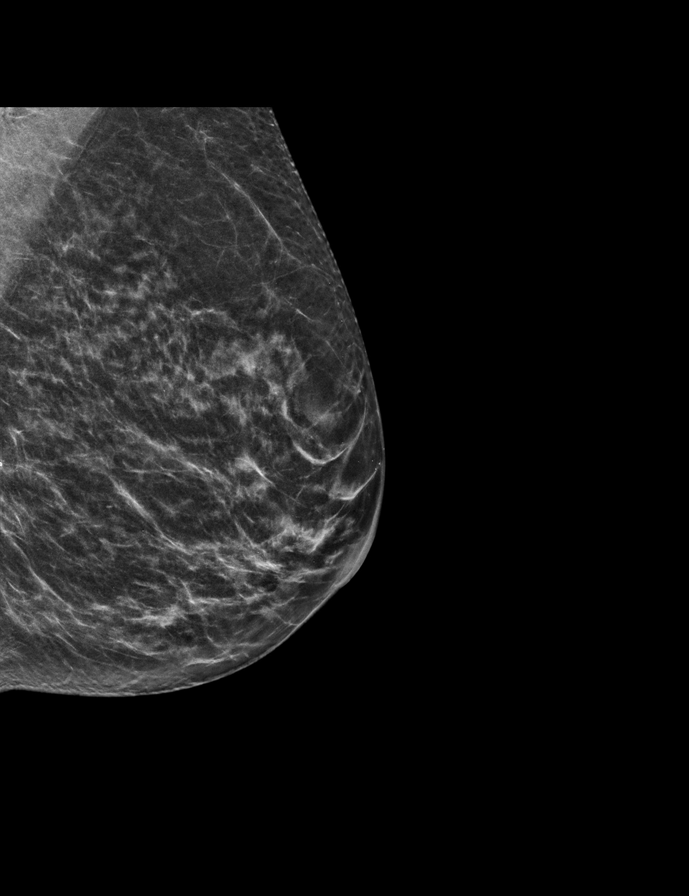

[L CC synth-2D]
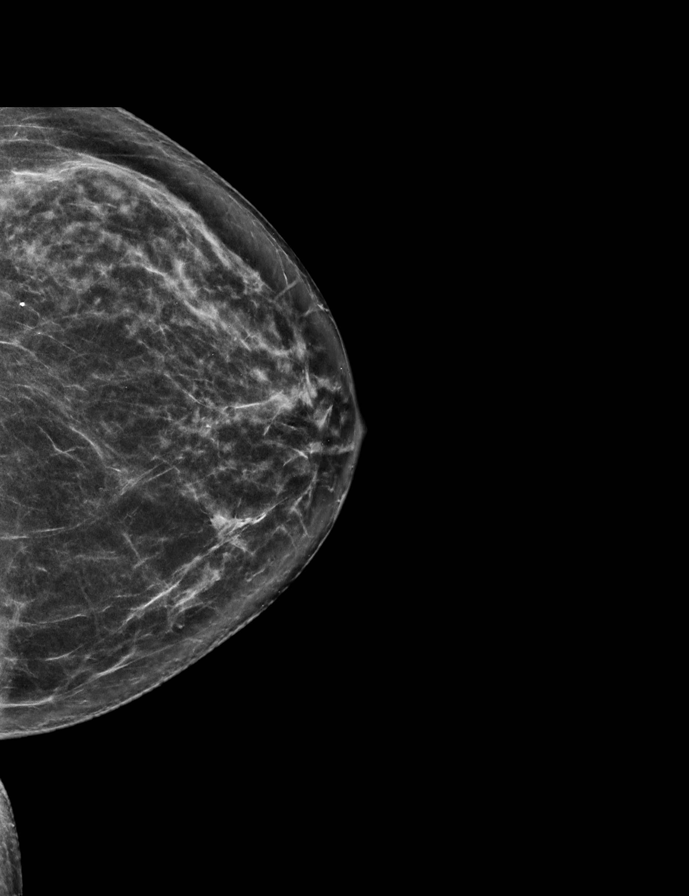

[R CC synth-2D]
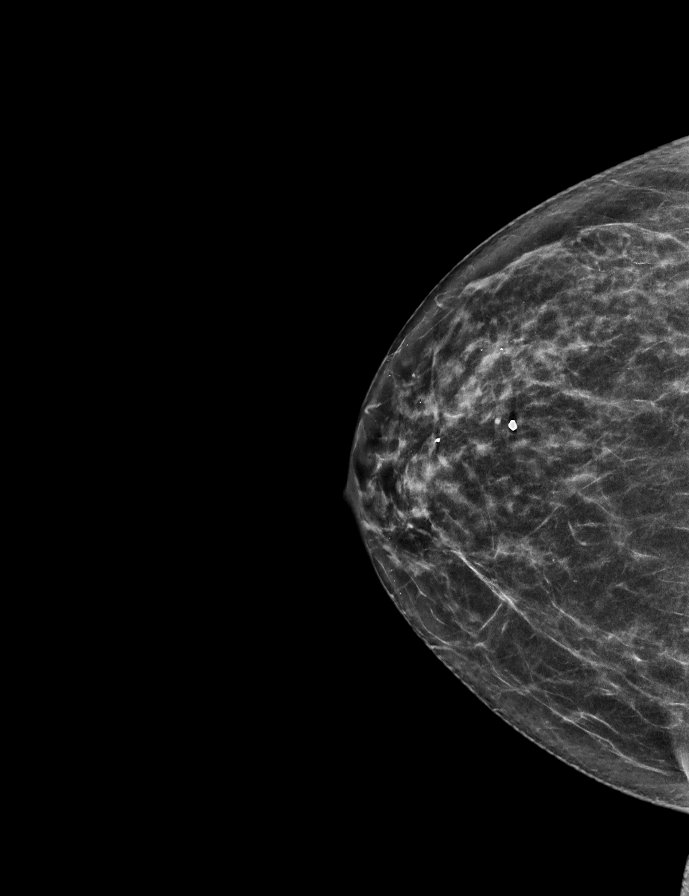

[L CC tomo · 2 of 64 frames shown]
[frame 21/64]
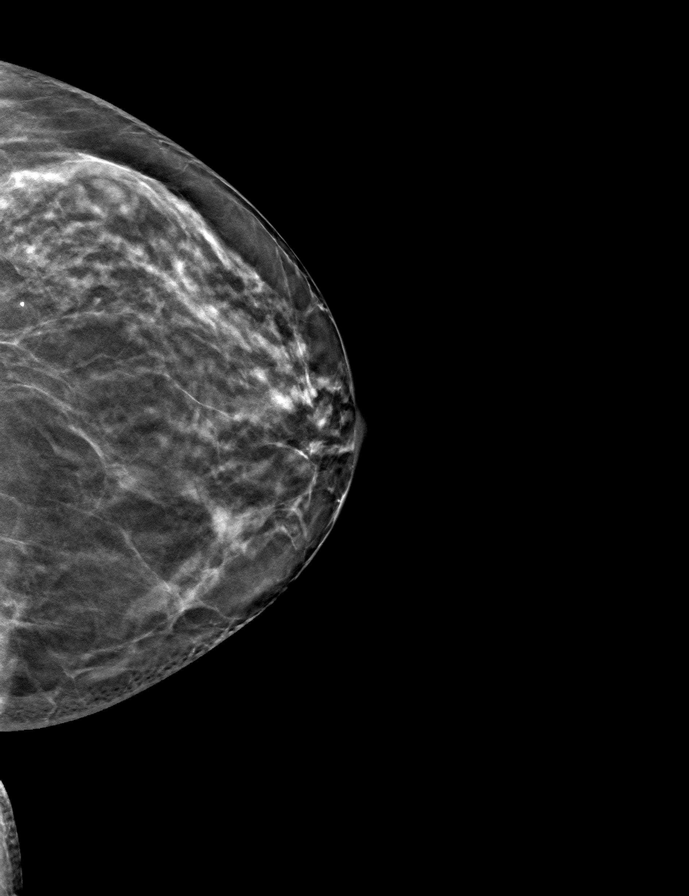
[frame 33/64]
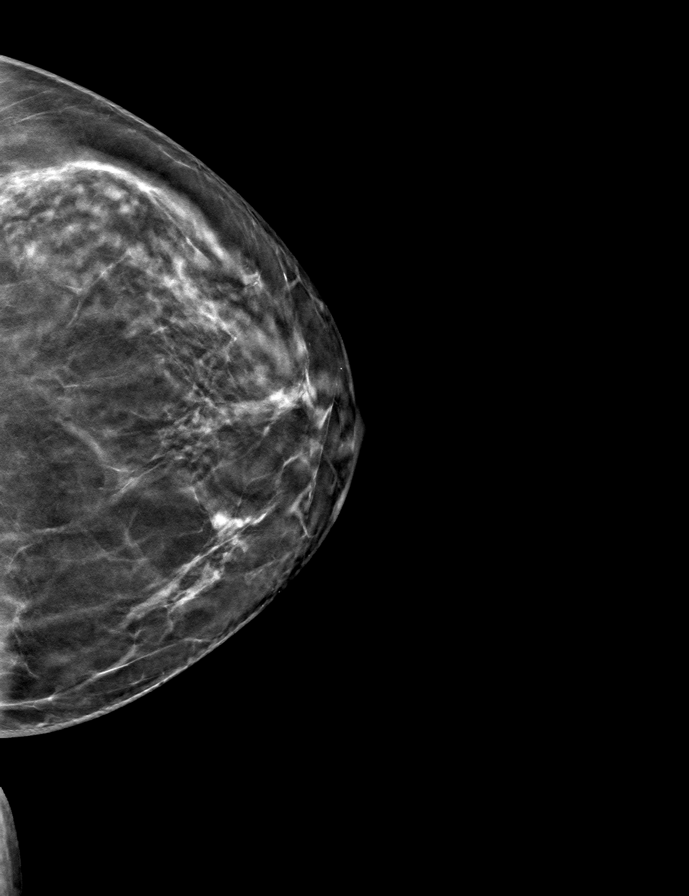

[R MLO tomo · tomo slice 30/59.0]
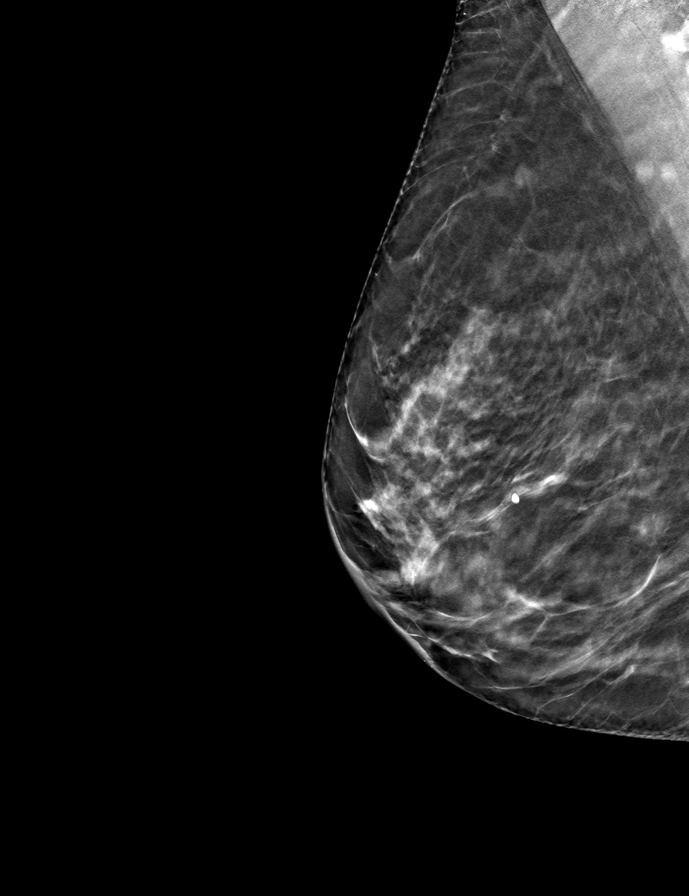

[R CC tomo · tomo slice 31/62.0]
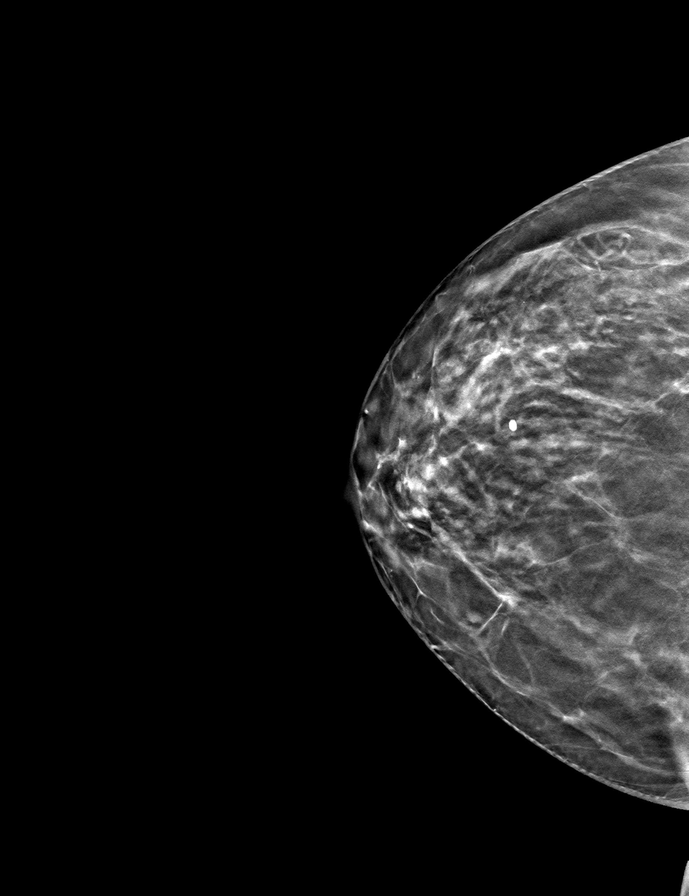

[L MLO tomo · tomo slice 29/57.0]
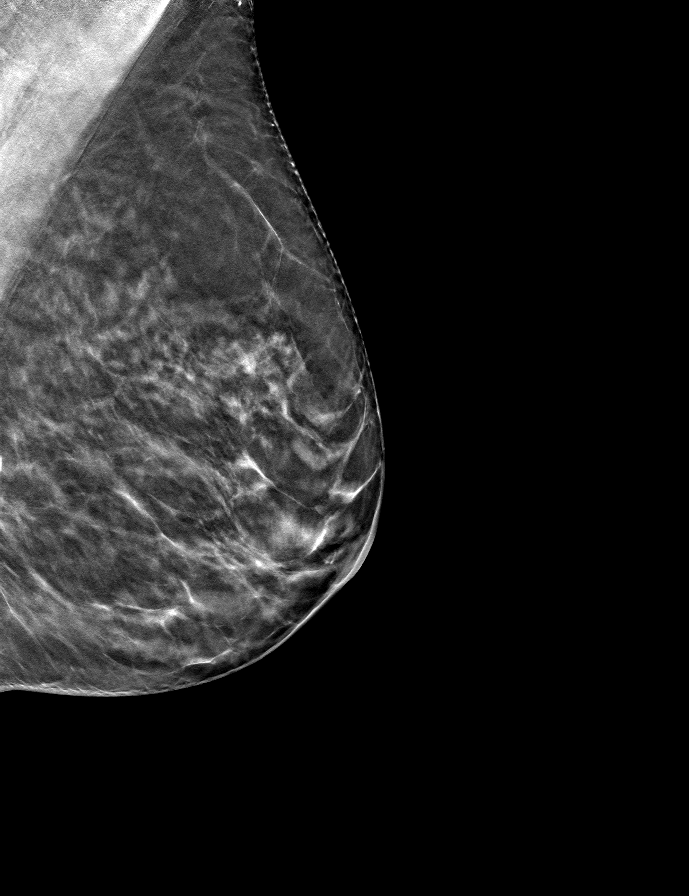

[9 of 24 positions shown; findings below may reference images not displayed]

ACR Breast Density Category b: There are scattered areas of
fibroglandular density.
FINDINGS: There are no findings suspicious for malignancy. Images were
processed with CAD.
IMPRESSION: No mammographic evidence of malignancy. A result letter of this
screening mammogram will be mailed directly to the patient.

RECOMMENDATION:
Screening mammogram in one year. (Code:[id number])

BI-RADS CATEGORY  1: Negative.

## 2020-06-16 DIAGNOSIS — H16223 Keratoconjunctivitis sicca, not specified as Sjogren's, bilateral: Secondary | ICD-10-CM | POA: Diagnosis not present

## 2020-06-16 DIAGNOSIS — Q120 Congenital cataract: Secondary | ICD-10-CM | POA: Diagnosis not present

## 2020-06-16 DIAGNOSIS — H2511 Age-related nuclear cataract, right eye: Secondary | ICD-10-CM | POA: Diagnosis not present

## 2020-06-16 DIAGNOSIS — H43811 Vitreous degeneration, right eye: Secondary | ICD-10-CM | POA: Diagnosis not present

## 2020-06-16 DIAGNOSIS — H1045 Other chronic allergic conjunctivitis: Secondary | ICD-10-CM | POA: Diagnosis not present

## 2020-06-17 DIAGNOSIS — Z23 Encounter for immunization: Secondary | ICD-10-CM | POA: Diagnosis not present

## 2020-06-30 DIAGNOSIS — M810 Age-related osteoporosis without current pathological fracture: Secondary | ICD-10-CM | POA: Diagnosis not present

## 2020-06-30 DIAGNOSIS — E559 Vitamin D deficiency, unspecified: Secondary | ICD-10-CM | POA: Diagnosis not present

## 2020-07-07 DIAGNOSIS — H1045 Other chronic allergic conjunctivitis: Secondary | ICD-10-CM | POA: Diagnosis not present

## 2020-07-07 DIAGNOSIS — H43811 Vitreous degeneration, right eye: Secondary | ICD-10-CM | POA: Diagnosis not present

## 2020-07-07 DIAGNOSIS — Q120 Congenital cataract: Secondary | ICD-10-CM | POA: Diagnosis not present

## 2020-07-07 DIAGNOSIS — H2511 Age-related nuclear cataract, right eye: Secondary | ICD-10-CM | POA: Diagnosis not present

## 2020-07-07 DIAGNOSIS — H16223 Keratoconjunctivitis sicca, not specified as Sjogren's, bilateral: Secondary | ICD-10-CM | POA: Diagnosis not present

## 2020-07-15 DIAGNOSIS — Z23 Encounter for immunization: Secondary | ICD-10-CM | POA: Diagnosis not present

## 2020-08-10 DIAGNOSIS — R350 Frequency of micturition: Secondary | ICD-10-CM | POA: Diagnosis not present

## 2020-08-10 DIAGNOSIS — N3941 Urge incontinence: Secondary | ICD-10-CM | POA: Diagnosis not present

## 2020-09-03 DIAGNOSIS — Z1152 Encounter for screening for COVID-19: Secondary | ICD-10-CM | POA: Diagnosis not present

## 2020-09-03 DIAGNOSIS — Z03818 Encounter for observation for suspected exposure to other biological agents ruled out: Secondary | ICD-10-CM | POA: Diagnosis not present

## 2020-09-09 DIAGNOSIS — E559 Vitamin D deficiency, unspecified: Secondary | ICD-10-CM | POA: Diagnosis not present

## 2020-09-09 DIAGNOSIS — E7800 Pure hypercholesterolemia, unspecified: Secondary | ICD-10-CM | POA: Diagnosis not present

## 2020-09-09 DIAGNOSIS — Z79899 Other long term (current) drug therapy: Secondary | ICD-10-CM | POA: Diagnosis not present

## 2020-09-14 DIAGNOSIS — N3941 Urge incontinence: Secondary | ICD-10-CM | POA: Diagnosis not present

## 2020-09-14 DIAGNOSIS — R350 Frequency of micturition: Secondary | ICD-10-CM | POA: Diagnosis not present

## 2020-09-16 DIAGNOSIS — E7800 Pure hypercholesterolemia, unspecified: Secondary | ICD-10-CM | POA: Diagnosis not present

## 2020-09-16 DIAGNOSIS — M810 Age-related osteoporosis without current pathological fracture: Secondary | ICD-10-CM | POA: Diagnosis not present

## 2020-09-16 DIAGNOSIS — E559 Vitamin D deficiency, unspecified: Secondary | ICD-10-CM | POA: Diagnosis not present

## 2020-09-16 DIAGNOSIS — Z0000 Encounter for general adult medical examination without abnormal findings: Secondary | ICD-10-CM | POA: Diagnosis not present

## 2020-09-16 DIAGNOSIS — Z8249 Family history of ischemic heart disease and other diseases of the circulatory system: Secondary | ICD-10-CM | POA: Diagnosis not present

## 2020-09-16 DIAGNOSIS — I700 Atherosclerosis of aorta: Secondary | ICD-10-CM | POA: Diagnosis not present

## 2020-09-16 DIAGNOSIS — Z79899 Other long term (current) drug therapy: Secondary | ICD-10-CM | POA: Diagnosis not present

## 2020-09-16 DIAGNOSIS — H269 Unspecified cataract: Secondary | ICD-10-CM | POA: Diagnosis not present

## 2020-10-21 ENCOUNTER — Telehealth: Payer: Self-pay

## 2020-10-21 NOTE — Telephone Encounter (Signed)
ERROR

## 2020-10-26 ENCOUNTER — Ambulatory Visit (INDEPENDENT_AMBULATORY_CARE_PROVIDER_SITE_OTHER): Payer: Medicare Other | Admitting: Interventional Cardiology

## 2020-10-26 ENCOUNTER — Other Ambulatory Visit: Payer: Self-pay

## 2020-10-26 ENCOUNTER — Encounter: Payer: Self-pay | Admitting: Interventional Cardiology

## 2020-10-26 ENCOUNTER — Encounter (INDEPENDENT_AMBULATORY_CARE_PROVIDER_SITE_OTHER): Payer: Self-pay

## 2020-10-26 VITALS — BP 130/90 | HR 96 | Ht 63.5 in | Wt 115.0 lb

## 2020-10-26 DIAGNOSIS — R059 Cough, unspecified: Secondary | ICD-10-CM

## 2020-10-26 DIAGNOSIS — E782 Mixed hyperlipidemia: Secondary | ICD-10-CM | POA: Diagnosis not present

## 2020-10-26 DIAGNOSIS — R072 Precordial pain: Secondary | ICD-10-CM | POA: Diagnosis not present

## 2020-10-26 DIAGNOSIS — Z8249 Family history of ischemic heart disease and other diseases of the circulatory system: Secondary | ICD-10-CM

## 2020-10-26 NOTE — Progress Notes (Signed)
Please sign attestation order for Myoview.  Thanks

## 2020-10-26 NOTE — Progress Notes (Signed)
Cardiology Office Note   Date:  10/26/2020   ID:  [name], [name] 1946/07/23, MRN [id number]  PCP:  Lawerance Cruel, MD    No chief complaint on file.  Chest pain  Wt Readings from Last 3 Encounters:  10/26/20 115 lb (52.2 kg)  09/02/18 110 lb 9.6 oz (50.2 kg)  05/29/18 109 lb 9.6 oz (49.7 kg)       History of Present Illness: Abigail Hawkins is a 75 y.o. female who is being seen today for the evaluation of family h/o heart disease at the request of Lawerance Cruel, MD.  Paternal grandfather died of heart disease- likely in his 41s.  Father died from heart disease at 55, aneurysm. Younger brother with 2 MIs, although he smokes.    Mother side of the family significant for strokes.   Denies : exertional Chest pain. Dizziness. Leg edema. Nitroglycerin use. Orthopnea. Palpitations. Paroxysmal nocturnal dyspnea. Syncope.   She does have aortic atherosclerosis.  She has a cough/hoarseness that persists.  She has had high cholesterol treated with Crestor.   She has had some chest pain.  It occurred at rest and was shortlived.   Exercises include balance exercises and TRX.   SHe has a lot of stress.       Past Medical History:  Diagnosis Date  . Aortic atherosclerosis ([place])   . Cataract   . CNS cysticercosis   . Cough variant asthma   . Dysphagia   . Fasciculation   . High risk medication use   . Hoarseness   . Hypercholesterolemia   . Menopausal flushing   . Onychomycosis   . Osteoporosis   . Other seborrheic dermatitis   . Sleep apnea   . Trouble swallowing   . Vaginal dryness   . Vitamin D deficiency     Past Surgical History:  Procedure Laterality Date  . ABDOMINAL HYSTERECTOMY    . ESOPHAGEAL MANOMETRY N/A 10/30/2018   Procedure: ESOPHAGEAL MANOMETRY (EM);  Surgeon: Wilford Corner, MD;  Location: [facility];  Service: Endoscopy;  Laterality: N/A;  . ESOPHAGOGASTRODUODENOSCOPY    . EYE MUSCLE SURGERY    . LESION REMOVAL    . [place]  IMPEDANCE STUDY N/A 10/30/2018   Procedure: [place] IMPEDANCE STUDY;  Surgeon: Wilford Corner, MD;  Location: [facility];  Service: Endoscopy;  Laterality: N/A;  . TONSILLECTOMY    . WISDOM TOOTH EXTRACTION       Current Outpatient Medications  Medication Sig Dispense Refill  . aspirin EC 81 MG tablet Take 81 mg by mouth daily.    . budesonide-formoterol (SYMBICORT) 160-4.5 MCG/ACT inhaler Inhale 2 puffs into the lungs 2 (two) times daily. 1 Inhaler 0  . cetirizine (ZYRTEC) 10 MG tablet Take 10 mg by mouth daily.    Marland Kitchen denosumab (PROLIA) 60 MG/ML SOSY injection Inject 60 mg into the skin every 6 (six) months.    . lactose free nutrition (BOOST) LIQD See admin instructions.    . Menaquinone-7 (VITAMIN K2) 100 MCG CAPS See admin instructions.    . mirabegron ER (MYRBETRIQ) 50 MG TB24 tablet Take 50 mg by mouth daily.    . rosuvastatin (CRESTOR) 20 MG tablet Take 20 mg by mouth daily.    Marland Kitchen VITAMIN D, CHOLECALCIFEROL, PO Take by mouth.    . vitamin k 100 MCG tablet Take 100 mcg by mouth daily.     No current facility-administered medications for this visit.    Allergies:   Tobramycin sulfate and  Penicillin g    Social History:  The patient  reports that she has never smoked. She has never used smokeless tobacco. She reports that she does not drink alcohol and does not use drugs.   Family History:  The patient's family history includes Heart disease in her brother and father.    ROS:  Please see the history of present illness.   Otherwise, review of systems are positive for cough.   All other systems are reviewed and negative.    PHYSICAL EXAM: VS:  BP 130/90   Pulse 96   Ht 5' 3.5" (1.613 m)   Wt 115 lb (52.2 kg)   SpO2 96%   BMI 20.05 kg/m  , BMI Body mass index is 20.05 kg/m. GEN: Well nourished, well developed, in no acute distress  HEENT: normal  Neck: no JVD, carotid bruits, or masses Cardiac: RRR; no murmurs, rubs, or gallops,no edema  Respiratory:  clear to  auscultation bilaterally, normal work of breathing GI: soft, nontender, nondistended, + BS MS: no deformity or atrophy  Skin: warm and dry, no rash Neuro:  Strength and sensation are intact Psych: euthymic mood, full affect   EKG:   The ekg ordered today demonstrates NSR, RBBB, no ST changes   Recent Labs: No results found for requested labs within last 8760 hours.   Lipid Panel No results found for: CHOL, TRIG, HDL, CHOLHDL, VLDL, LDLCALC, LDLDIRECT   Other studies Reviewed: Additional studies/ records that were reviewed today with results demonstrating: records from PMD reviewed.   ASSESSMENT AND PLAN:  1. Chest pain: Some atypical features but several RF or CAD.  Aortic atherosclerosis noted by PMD in his note.  Plan for stress test.  Since she is already on a statin, will plan on stress test.  She is likely to have coronary calcium based on age, family history and hyperlipidemia. Need to look for flow limiting CAD with stress test.  2. Hyperlipidemia: COntinue Crestor with target LDL < 100.  3. Family h/o CAD: Early CAD noted in her grandfather.  4. Persistent cough and hoarseness: followed by PMD.  No signs of fluid overload on exam.   I don't think this is cardiac in origin.   Current medicines are reviewed at length with the patient today.  The patient concerns regarding her medicines were addressed.  The following changes have been made:  No change  Labs/ tests ordered today include:  No orders of the defined types were placed in this encounter.   Recommend 150 minutes/week of aerobic exercise Low fat, low carb, high fiber diet recommended  Disposition:   FU for stress test   Signed, Larae Grooms, MD  10/26/2020 11:49 AM    [facility] [place], [place], [place]  [address] Phone: [phone]; Fax: [phone]

## 2020-10-26 NOTE — Patient Instructions (Addendum)
Medication Instructions:  Your physician recommends that you continue on your current medications as directed. Please refer to the Current Medication list given to you today.  *If you need a refill on your cardiac medications before your next appointment, please call your pharmacy*   Lab Work: NONE If you have labs (blood work) drawn today and your tests are completely normal, you will receive your results only by: Marland Kitchen MyChart Message (if you have MyChart) OR . A paper copy in the mail If you have any lab test that is abnormal or we need to change your treatment, we will call you to review the results.   Testing/Procedures: Your physician has requested that you have en exercise stress myoview. For further information please visit [email or web address]. Please follow instruction sheet, as given  Due to recent COVID-19 restrictions implemented by our local and state authorities and in an effort to keep both patients and staff as safe as possible, our hospital system requires COVID-19 testing prior to certain scheduled hospital procedures.  Please go to [address] [place], [place] [address] on _____ at _____  .  This is a drive up testing site.  You will not need to exit your vehicle.  You will not be billed at the time of testing but may receive a bill later depending on your insurance. You must agree to self-quarantine from the time of your testing until the procedure date on ____.  This should included staying home with ONLY the people you live with.  Avoid take-out, grocery store shopping or leaving the house for any non-emergent reason.  Failure to have your COVID-19 test done on the date and time you have been scheduled will result in cancellation of your procedure.  Please call our office at [phone] if you have any questions. .    Follow-Up: Follow Up based on results of exercise stress myoview. At [facility], you and your health needs are our priority.  As part of our  continuing mission to provide you with exceptional heart care, we have created designated Provider Care Teams.  These Care Teams include your primary Cardiologist (physician) and Advanced Practice Providers (APPs -  Physician Assistants and Nurse Practitioners) who all work together to provide you with the care you need, when you need it.  We recommend signing up for the patient portal called "MyChart".  Sign up information is provided on this After Visit Summary.  MyChart is used to connect with patients for Virtual Visits (Telemedicine).  Patients are able to view lab/test results, encounter notes, upcoming appointments, etc.  Non-urgent messages can be sent to your provider as well.   To learn more about what you can do with MyChart, go to [email or web address].

## 2020-10-29 DIAGNOSIS — Z20822 Contact with and (suspected) exposure to covid-19: Secondary | ICD-10-CM | POA: Diagnosis not present

## 2020-11-02 ENCOUNTER — Telehealth: Payer: Self-pay | Admitting: Interventional Cardiology

## 2020-11-02 NOTE — Telephone Encounter (Signed)
I spoke with patient. She had covid test on 1/28 that came back positive. Symptoms started on 1/27.  Stress test has been rescheduled to 2/24. I told patient she would not need covid testing prior to stress test.  She will let us know if she has fever or any ongoing symptoms near the test time.

## 2020-11-02 NOTE — Telephone Encounter (Signed)
Patient rescheduled her stress test for 11/25/20 due to having covid. She needs her covid test rescheduled, please advise.

## 2020-11-08 ENCOUNTER — Other Ambulatory Visit: Payer: Medicare Other

## 2020-11-08 NOTE — Telephone Encounter (Signed)
I spoke with patient. Covid test was done at [facility] in [place].  Patient will sent results through my chart

## 2020-11-11 ENCOUNTER — Encounter (HOSPITAL_COMMUNITY): Payer: Medicare Other

## 2020-11-17 DIAGNOSIS — H524 Presbyopia: Secondary | ICD-10-CM | POA: Diagnosis not present

## 2020-11-17 DIAGNOSIS — H43813 Vitreous degeneration, bilateral: Secondary | ICD-10-CM | POA: Diagnosis not present

## 2020-11-17 DIAGNOSIS — H4720 Unspecified optic atrophy: Secondary | ICD-10-CM | POA: Diagnosis not present

## 2020-11-17 DIAGNOSIS — H2511 Age-related nuclear cataract, right eye: Secondary | ICD-10-CM | POA: Diagnosis not present

## 2020-11-24 ENCOUNTER — Telehealth (HOSPITAL_COMMUNITY): Payer: Self-pay

## 2020-11-24 NOTE — Telephone Encounter (Signed)
Detailed instructions left on the patient's answering machine. Asked to call back with any questions. S.Clifford Benninger EMTP 

## 2020-11-25 ENCOUNTER — Other Ambulatory Visit: Payer: Self-pay

## 2020-11-25 ENCOUNTER — Ambulatory Visit (HOSPITAL_COMMUNITY): Payer: Medicare Other | Attending: Cardiology

## 2020-11-25 DIAGNOSIS — R072 Precordial pain: Secondary | ICD-10-CM | POA: Diagnosis not present

## 2020-11-25 DIAGNOSIS — E782 Mixed hyperlipidemia: Secondary | ICD-10-CM | POA: Diagnosis not present

## 2020-11-25 DIAGNOSIS — Z8249 Family history of ischemic heart disease and other diseases of the circulatory system: Secondary | ICD-10-CM | POA: Diagnosis not present

## 2020-11-25 DIAGNOSIS — R059 Cough, unspecified: Secondary | ICD-10-CM

## 2020-11-25 LAB — MYOCARDIAL PERFUSION IMAGING
Estimated workload: 6.6 METS
Exercise duration (min): 4 min
Exercise duration (sec): 44 s
LV dias vol: 43 mL (ref 46–106)
LV sys vol: 11 mL
MPHR: 144 {beats}/min
Peak HR: 144 {beats}/min
Percent HR: 101 %
RPE: 19
Rest HR: 86 {beats}/min
SDS: 1
SRS: 0
SSS: 1
TID: 1.05

## 2020-11-25 MED ORDER — TECHNETIUM TC 99M TETROFOSMIN IV KIT
11.0000 | PACK | Freq: Once | INTRAVENOUS | Status: AC | PRN
  Administered 2020-11-25: 11 via INTRAVENOUS
  Filled 2020-11-25: qty 11

## 2020-11-25 MED ORDER — TECHNETIUM TC 99M TETROFOSMIN IV KIT
33.0000 | PACK | Freq: Once | INTRAVENOUS | Status: AC | PRN
  Administered 2020-11-25: 33 via INTRAVENOUS
  Filled 2020-11-25: qty 33

## 2020-11-30 DIAGNOSIS — L219 Seborrheic dermatitis, unspecified: Secondary | ICD-10-CM | POA: Diagnosis not present

## 2020-11-30 DIAGNOSIS — L578 Other skin changes due to chronic exposure to nonionizing radiation: Secondary | ICD-10-CM | POA: Diagnosis not present

## 2020-11-30 DIAGNOSIS — L821 Other seborrheic keratosis: Secondary | ICD-10-CM | POA: Diagnosis not present

## 2020-11-30 DIAGNOSIS — D2271 Melanocytic nevi of right lower limb, including hip: Secondary | ICD-10-CM | POA: Diagnosis not present

## 2020-11-30 DIAGNOSIS — Z85828 Personal history of other malignant neoplasm of skin: Secondary | ICD-10-CM | POA: Diagnosis not present

## 2020-11-30 DIAGNOSIS — L570 Actinic keratosis: Secondary | ICD-10-CM | POA: Diagnosis not present

## 2020-12-13 DIAGNOSIS — R5383 Other fatigue: Secondary | ICD-10-CM | POA: Diagnosis not present

## 2020-12-13 DIAGNOSIS — M810 Age-related osteoporosis without current pathological fracture: Secondary | ICD-10-CM | POA: Diagnosis not present

## 2020-12-13 DIAGNOSIS — E559 Vitamin D deficiency, unspecified: Secondary | ICD-10-CM | POA: Diagnosis not present

## 2020-12-29 DIAGNOSIS — M810 Age-related osteoporosis without current pathological fracture: Secondary | ICD-10-CM | POA: Diagnosis not present

## 2020-12-29 DIAGNOSIS — E559 Vitamin D deficiency, unspecified: Secondary | ICD-10-CM | POA: Diagnosis not present

## 2021-01-04 ENCOUNTER — Encounter: Payer: Self-pay | Admitting: Internal Medicine

## 2021-01-04 ENCOUNTER — Telehealth: Payer: Self-pay | Admitting: Internal Medicine

## 2021-01-04 ENCOUNTER — Other Ambulatory Visit: Payer: Self-pay

## 2021-01-04 ENCOUNTER — Ambulatory Visit (INDEPENDENT_AMBULATORY_CARE_PROVIDER_SITE_OTHER): Payer: Medicare Other | Admitting: Internal Medicine

## 2021-01-04 VITALS — BP 118/70 | HR 82 | Temp 97.6°F | Ht 63.0 in | Wt 115.2 lb

## 2021-01-04 DIAGNOSIS — R059 Cough, unspecified: Secondary | ICD-10-CM | POA: Diagnosis not present

## 2021-01-04 NOTE — Telephone Encounter (Signed)
Called and spoke with Judeen Hammans from Respiratory Therapy.  Judeen Hammans is wanting to know if pt's last PFT on 03/2018 is too far out or if this will be okay or if pt should have PFT done instead of having Methacholine Challenge first.  Judeen Hammans said that pt is currently scheduled to have Methacholine Challenge 4/12 at 11:00 and said if needed, they could switch this from pt having a Methacholine Challenge to her having PFT instead if needed.  Dr. Shearon Stalls, please advise.

## 2021-01-04 NOTE — Patient Instructions (Addendum)
The patient should have follow up scheduled with myself in 1 months.   Prior to next visit patient should have:  Methacholine challenge test.    Hold off on inhaler therapies at this time.  I will see you back after this test - the goal is to try and rule out asthma.

## 2021-01-04 NOTE — Telephone Encounter (Signed)
Attempted to call Abigail Hawkins with [facility] Respiratory Dept but unable to reach. Left message for her to return call.

## 2021-01-04 NOTE — Progress Notes (Signed)
Abigail Hawkins    [id number]    [birth year]  Primary Care Physician:Ross, Dwyane Luo, MD Date of Appointment: 01/04/2021 Established Patient Visit  Chief complaint:   Chief Complaint  Patient presents with  . Follow-up    Cough that is sometimes productive with clear sputum for past few years.      HPI: Abigail Hawkins is a 75 y.o. woman with history of mild itnermittent asthma, chronic cough.   Interval Updates: Here to establish care with me today.   She has had cough for many years. Usually dry - she feels like something is trying to break loose in her upper chest.  She has never taken the symbicort as a maintenance.  She has been managed on prn ICS-LABA Symbicort. She stopped taking it because she didn't think it was working. Cough gets worse as the day goes on, gets worse when she is sitting in a chair. She is able to stay active by going to the gym, and recently did a three mile hike. She didn't have dyspnea, but she did have cough that interrupted her activity (she didn't have to stop.)   She has voice hoarseness for many years. Talking and laughing sometimes make the cough worse.  ENT - she saw many years ago and had an NP scope. She said they looked irritated but nothing to be concerned about.   She had a stress test with cardiology which was unremarkable - this was just a couple months ago.   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   She is retired from being an [place] to [employer].   Never smoker. Had passive smoke exposure in childhood.  No family history of asthma. She has dry eyes chronically and sees ophthalmology. Denies seasonal allergies.   I have reviewed the patient's family social and past medical history and updated as appropriate.   Past Medical History:  Diagnosis Date  . Aortic atherosclerosis ([place])   . Cataract   . CNS cysticercosis   . Cough variant asthma   . Dysphagia   . Fasciculation   . High risk  medication use   . Hoarseness   . Hypercholesterolemia   . Menopausal flushing   . Onychomycosis   . Osteoporosis   . Other seborrheic dermatitis   . Sleep apnea   . Trouble swallowing   . Vaginal dryness   . Vitamin D deficiency     Past Surgical History:  Procedure Laterality Date  . ABDOMINAL HYSTERECTOMY    . ESOPHAGEAL MANOMETRY N/A 10/30/2018   Procedure: ESOPHAGEAL MANOMETRY (EM);  Surgeon: Wilford Corner, MD;  Location: [facility];  Service: Endoscopy;  Laterality: N/A;  . ESOPHAGOGASTRODUODENOSCOPY    . EYE MUSCLE SURGERY    . LESION REMOVAL    . [place] IMPEDANCE STUDY N/A 10/30/2018   Procedure: [place] IMPEDANCE STUDY;  Surgeon: Wilford Corner, MD;  Location: [facility];  Service: Endoscopy;  Laterality: N/A;  . TONSILLECTOMY    . WISDOM TOOTH EXTRACTION      Family History  Problem Relation Age of Onset  . Heart disease Father   . Heart disease Brother   . Breast cancer Neg Hx     Social History   Occupational History  . Not on file  Tobacco Use  . Smoking status: Never Smoker  . Smokeless tobacco: Never Used  Substance and Sexual Activity  . Alcohol use: No  . Drug use: No  .  Sexual activity: Yes     Physical Exam: Blood pressure 118/70, pulse 82, temperature 97.6 F (36.4 C), temperature source Temporal, height 5\' 3"  (1.6 m), weight 115 lb 3.2 oz (52.3 kg), SpO2 97 %.  Gen:      No acute distress ENT:  no nasal polyps, mucus membranes moist Lungs:    No increased respiratory effort, symmetric chest wall excursion, clear to auscultation bilaterally, no wheezes or crackles, mild kyphosis CV:         Regular rate and rhythm; no murmurs, rubs, or gallops.  No pedal edema   Data Reviewed: Imaging: I have personally reviewed the CT Chest from  PFTs:  PFT Results Latest Ref Rng & Units 03/12/2018  FVC-Pre L 1.95  FVC-Predicted Pre % 68  FVC-Post L 1.88  FVC-Predicted Post % 65  Pre FEV1/FVC % % 77  Post FEV1/FCV % % 81  FEV1-Pre L 1.49   FEV1-Predicted Pre % 69  FEV1-Post L 1.53  DLCO uncorrected ml/min/mmHg 17.71  DLCO UNC% % 72  DLCO corrected ml/min/mmHg 18.17  DLCO COR %Predicted % 74  DLVA Predicted % 112  TLC L 3.89  TLC % Predicted % 77  RV % Predicted % 85   I have personally reviewed the patient's PFTs and they show no airflow limitation  Impendance testing Jan 2020 shows no significant reflux.  Echocardiogram LVEF normal, Grade 1 diastolic dysfunction, no significant valvular disease  Nuclear stress EF: 74%.  Blood pressure demonstrated a hypertensive response to exercise.  There was no ST segment deviation noted during stress.  The study is normal.  This is a low risk study.  The left ventricular ejection fraction is hyperdynamic (>65%).   Normal pharmacologic nuclear stress test with no evidence for prior infarct or ischemia. LVEF 74%.   Labs:  Lab Results  Component Value Date   HGB 12.6 04/29/2018    Immunization status: Immunization History  Administered Date(s) Administered  . Influenza, High Dose Seasonal PF 07/13/2017, 06/18/2018  . Influenza-Unspecified 06/02/2014, 07/10/2016, 07/08/2019, 07/02/2020  . PFIZER(Purple Top)SARS-COV-2 Vaccination 10/07/2019, 10/28/2019  . Zoster Recombinat (Shingrix) 02/02/2012    Assessment:  Chronic Cough  Plan/Recommendations: [name] is a 75 y.o. woman with chronic cough for several years. She has had empiric treatment for cough variant asthma, as well as evaluation for sinus disease and reflux which has all be fairly negative.  I think neurogenic cough is on the differential. Would abstain from further inhaler therapies at this time - she is not taking anything anyway right now.  We will get a methacholine challenge test to rule out asthma. Then consider treatment for neurogenic cough. I will see her back after PFT.    Return to Care: Return in about 1 month (around 02/03/2021).   Lenice Llamas, MD Pulmonary and [place]

## 2021-01-05 NOTE — Telephone Encounter (Signed)
No I definitely want her to have methacholine challenge. They do a baseline spirometry at the start of the test which is enough.

## 2021-01-05 NOTE — Telephone Encounter (Signed)
Patient aware and will get MCT as ordered

## 2021-01-11 ENCOUNTER — Ambulatory Visit (HOSPITAL_COMMUNITY)
Admission: RE | Admit: 2021-01-11 | Discharge: 2021-01-11 | Disposition: A | Discharge status: Home / Self Care | Payer: Medicare Other | Source: Ambulatory Visit | Attending: Internal Medicine | Admitting: Internal Medicine

## 2021-01-11 ENCOUNTER — Other Ambulatory Visit: Payer: Self-pay

## 2021-01-11 DIAGNOSIS — J4520 Mild intermittent asthma, uncomplicated: Secondary | ICD-10-CM | POA: Diagnosis not present

## 2021-01-11 DIAGNOSIS — R053 Chronic cough: Secondary | ICD-10-CM | POA: Insufficient documentation

## 2021-01-11 DIAGNOSIS — R059 Cough, unspecified: Secondary | ICD-10-CM

## 2021-01-11 LAB — PULMONARY FUNCTION TEST
FEF 25-75 Post: 1.47 L/sec
FEF 25-75 Pre: 1.31 L/sec
FEF2575-%Change-Post: 12 %
FEF2575-%Pred-Post: 94 %
FEF2575-%Pred-Pre: 84 %
FEV1-%Change-Post: 1 %
FEV1-%Pred-Post: 79 %
FEV1-%Pred-Pre: 78 %
FEV1-Post: 1.58 L
FEV1-Pre: 1.55 L
FEV1FVC-%Change-Post: 0 %
FEV1FVC-%Pred-Pre: 105 %
FEV6-%Change-Post: 0 %
FEV6-%Pred-Post: 78 %
FEV6-%Pred-Pre: 78 %
FEV6-Post: 1.98 L
FEV6-Pre: 1.97 L
FEV6FVC-%Change-Post: -2 %
FEV6FVC-%Pred-Post: 102 %
FEV6FVC-%Pred-Pre: 105 %
FVC-%Change-Post: 2 %
FVC-%Pred-Post: 76 %
FVC-%Pred-Pre: 74 %
FVC-Post: 2.03 L
FVC-Pre: 1.97 L
Post FEV1/FVC ratio: 78 %
Post FEV6/FVC ratio: 98 %
Pre FEV1/FVC ratio: 79 %
Pre FEV6/FVC Ratio: 100 %

## 2021-01-11 MED ORDER — METHACHOLINE 16 MG/ML NEB SOLN
3.0000 mL | Freq: Once | RESPIRATORY_TRACT | Status: DC
  Filled 2021-01-11: qty 3

## 2021-01-11 MED ORDER — METHACHOLINE 0.25 MG/ML NEB SOLN
3.0000 mL | Freq: Once | RESPIRATORY_TRACT | Status: AC
  Administered 2021-01-11: 0.75 mg via RESPIRATORY_TRACT
  Filled 2021-01-11: qty 3

## 2021-01-11 MED ORDER — METHACHOLINE 0 MG/ML NEB SOLN
3.0000 mL | Freq: Once | RESPIRATORY_TRACT | Status: AC
  Administered 2021-01-11: 3 mL via RESPIRATORY_TRACT
  Filled 2021-01-11: qty 3

## 2021-01-11 MED ORDER — ALBUTEROL SULFATE (2.5 MG/3ML) 0.083% IN NEBU
2.5000 mg | INHALATION_SOLUTION | Freq: Once | RESPIRATORY_TRACT | Status: AC
  Administered 2021-01-11: 2.5 mg via RESPIRATORY_TRACT

## 2021-01-11 MED ORDER — METHACHOLINE 0.0625 MG/ML NEB SOLN
3.0000 mL | Freq: Once | RESPIRATORY_TRACT | Status: AC
  Administered 2021-01-11: 0.0625 mg via RESPIRATORY_TRACT
  Filled 2021-01-11: qty 3

## 2021-01-11 MED ORDER — METHACHOLINE 4 MG/ML NEB SOLN
3.0000 mL | Freq: Once | RESPIRATORY_TRACT | Status: AC
  Administered 2021-01-11: 12 mg via RESPIRATORY_TRACT
  Filled 2021-01-11: qty 3

## 2021-01-11 MED ORDER — METHACHOLINE 1 MG/ML NEB SOLN
3.0000 mL | Freq: Once | RESPIRATORY_TRACT | Status: AC
  Administered 2021-01-11: 3 mg via RESPIRATORY_TRACT
  Filled 2021-01-11: qty 3

## 2021-01-21 DIAGNOSIS — Z79899 Other long term (current) drug therapy: Secondary | ICD-10-CM | POA: Diagnosis not present

## 2021-01-21 DIAGNOSIS — N3941 Urge incontinence: Secondary | ICD-10-CM | POA: Diagnosis not present

## 2021-01-21 DIAGNOSIS — R053 Chronic cough: Secondary | ICD-10-CM | POA: Diagnosis not present

## 2021-01-21 DIAGNOSIS — I700 Atherosclerosis of aorta: Secondary | ICD-10-CM | POA: Diagnosis not present

## 2021-01-21 DIAGNOSIS — M810 Age-related osteoporosis without current pathological fracture: Secondary | ICD-10-CM | POA: Diagnosis not present

## 2021-01-21 DIAGNOSIS — E785 Hyperlipidemia, unspecified: Secondary | ICD-10-CM | POA: Diagnosis not present

## 2021-01-25 ENCOUNTER — Other Ambulatory Visit: Payer: Self-pay | Admitting: Family Medicine

## 2021-01-25 DIAGNOSIS — Z1231 Encounter for screening mammogram for malignant neoplasm of breast: Secondary | ICD-10-CM

## 2021-02-03 ENCOUNTER — Ambulatory Visit: Payer: Medicare Other | Admitting: Internal Medicine

## 2021-02-04 DIAGNOSIS — Z20822 Contact with and (suspected) exposure to covid-19: Secondary | ICD-10-CM | POA: Diagnosis not present

## 2021-02-08 DIAGNOSIS — R194 Change in bowel habit: Secondary | ICD-10-CM | POA: Diagnosis not present

## 2021-02-08 DIAGNOSIS — Z8371 Family history of colonic polyps: Secondary | ICD-10-CM | POA: Diagnosis not present

## 2021-02-12 DIAGNOSIS — Z23 Encounter for immunization: Secondary | ICD-10-CM | POA: Diagnosis not present

## 2021-03-01 ENCOUNTER — Ambulatory Visit (INDEPENDENT_AMBULATORY_CARE_PROVIDER_SITE_OTHER): Payer: Medicare Other | Admitting: Internal Medicine

## 2021-03-01 ENCOUNTER — Encounter: Payer: Self-pay | Admitting: Internal Medicine

## 2021-03-01 ENCOUNTER — Other Ambulatory Visit: Payer: Self-pay

## 2021-03-01 VITALS — BP 112/74 | HR 80 | Temp 97.8°F | Ht 63.0 in | Wt 112.0 lb

## 2021-03-01 DIAGNOSIS — J4540 Moderate persistent asthma, uncomplicated: Secondary | ICD-10-CM | POA: Diagnosis not present

## 2021-03-01 DIAGNOSIS — R053 Chronic cough: Secondary | ICD-10-CM

## 2021-03-01 MED ORDER — BUDESONIDE 0.5 MG/2ML IN SUSP: 0.5000 mg | Freq: Two times a day (BID) | RESPIRATORY_TRACT | 3 refills | Status: DC

## 2021-03-01 NOTE — Progress Notes (Signed)
Abigail Hawkins    [id number]    Feb 19, 1946  Primary Care Physician:Ross, Dwyane Luo, MD Date of Appointment: 03/01/2021 Established Patient Visit  Chief complaint:   Chief Complaint  Patient presents with  . Follow-up    Cough is the same as last visit     HPI: Abigail Hawkins is a 75 y.o. woman with history of mild itnermittent asthma, chronic cough.   Interval Updates: Chronic cough persistent. No hospitalizations or ED visits. Methacholine challenge test done in April was positive. Still continues to have dry cough which is worse when bending forward.    I have reviewed the patient's family social and past medical history and updated as appropriate.   Past Medical History:  Diagnosis Date  . Aortic atherosclerosis ([place])   . Cataract   . CNS cysticercosis   . Cough variant asthma   . Dysphagia   . Fasciculation   . High risk medication use   . Hoarseness   . Hypercholesterolemia   . Menopausal flushing   . Onychomycosis   . Osteoporosis   . Other seborrheic dermatitis   . Sleep apnea   . Trouble swallowing   . Vaginal dryness   . Vitamin D deficiency     Past Surgical History:  Procedure Laterality Date  . ABDOMINAL HYSTERECTOMY    . ESOPHAGEAL MANOMETRY N/A 10/30/2018   Procedure: ESOPHAGEAL MANOMETRY (EM);  Surgeon: Wilford Corner, MD;  Location: [facility];  Service: Endoscopy;  Laterality: N/A;  . ESOPHAGOGASTRODUODENOSCOPY    . EYE MUSCLE SURGERY    . LESION REMOVAL    . [place] IMPEDANCE STUDY N/A 10/30/2018   Procedure: [place] IMPEDANCE STUDY;  Surgeon: Wilford Corner, MD;  Location: [facility];  Service: Endoscopy;  Laterality: N/A;  . TONSILLECTOMY    . WISDOM TOOTH EXTRACTION      Family History  Problem Relation Age of Onset  . Heart disease Father   . Heart disease Brother   . Breast cancer Neg Hx     Social History   Occupational History  . Not on file  Tobacco Use  . Smoking status: Never Smoker  .  Smokeless tobacco: Never Used  Substance and Sexual Activity  . Alcohol use: No  . Drug use: No  . Sexual activity: Yes     Physical Exam: Blood pressure 112/74, pulse 80, temperature 97.8 F (36.6 C), height 5\' 3"  (1.6 m), weight 112 lb (50.8 kg), SpO2 96 %.  Gen:      No acute distress, frequent coughing ENT:  no nasal polyps, mucus membranes moist Lungs:   Scoliosis, no increased work of breathing, no wheezes or crackles CV:         RRR no mrg   Data Reviewed: Imaging: I have personally reviewed the CT Chest from  PFTs:  PFT Results Latest Ref Rng & Units 01/11/2021 03/12/2018  FVC-Pre L 1.97 1.95  FVC-Predicted Pre % 74 68  FVC-Post L 2.03 1.88  FVC-Predicted Post % 76 65  Pre FEV1/FVC % % 79 77  Post FEV1/FCV % % 78 81  FEV1-Pre L 1.55 1.49  FEV1-Predicted Pre % 78 69  FEV1-Post L 1.58 1.53  DLCO uncorrected ml/min/mmHg - 17.71  DLCO UNC% % - 72  DLCO corrected ml/min/mmHg - 18.17  DLCO COR %Predicted % - 74  DLVA Predicted % - 112  TLC L - 3.89  TLC % Predicted % - 77  RV % Predicted % -  60   I have personally reviewed the patient's PFTs and they show no airflow limitation  Methoacholine challenge test 01/11/21 - POSITIVE. Drop in 23% of FEV1 at 4 mg methacholine  Impendance testing Jan 2020 shows no significant reflux.  Echocardiogram LVEF normal, Grade 1 diastolic dysfunction, no significant valvular disease  Nuclear stress EF: 74%.  Blood pressure demonstrated a hypertensive response to exercise.  There was no ST segment deviation noted during stress.  The study is normal.  This is a low risk study.  The left ventricular ejection fraction is hyperdynamic (>65%).   Normal pharmacologic nuclear stress test with no evidence for prior infarct or ischemia. LVEF 74%.   Labs:  Lab Results  Component Value Date   HGB 12.6 04/29/2018    Immunization status: Immunization History  Administered Date(s) Administered  . Influenza, High Dose Seasonal  PF 07/13/2017, 06/18/2018  . Influenza-Unspecified 06/02/2014, 07/10/2016, 07/08/2019, 07/02/2020  . PFIZER(Purple Top)SARS-COV-2 Vaccination 10/07/2019, 10/28/2019, 07/15/2020  . Zoster Recombinat (Shingrix) 02/02/2012    Assessment:  Chronic Cough secondary to asthma - POSITIVE methacholine challenge test.   Plan/Recommendations: Will proceed with budesonide nebulizers BID She may have worsening irritation of her cough with MDI/DPI If no improvement consider neurogenic cough treatment.   Return to Care: Return in about 4 weeks (around 03/29/2021).   Lenice Llamas, MD Pulmonary and [place]

## 2021-03-01 NOTE — Patient Instructions (Signed)
The patient should have follow up scheduled with myself in 1 months.   Start taking budesonide nebulizer once in the morning, once at night.   Machine will come to your house through a DME.

## 2021-03-02 DIAGNOSIS — L570 Actinic keratosis: Secondary | ICD-10-CM | POA: Diagnosis not present

## 2021-03-07 ENCOUNTER — Telehealth: Payer: Self-pay | Admitting: Internal Medicine

## 2021-03-07 NOTE — Telephone Encounter (Signed)
Spoke with pharmacy and gave dx code of J45.40. Pharmacist advised that rx went through.  Called and spoke with pt and advised that dx code was given to pharmacy and that they were getting rx ready. Pt verbalized understanding. Nothing further needed.

## 2021-03-23 ENCOUNTER — Other Ambulatory Visit: Payer: Self-pay

## 2021-03-23 ENCOUNTER — Ambulatory Visit
Admission: RE | Admit: 2021-03-23 | Discharge: 2021-03-23 | Disposition: A | Discharge status: Home / Self Care | Payer: Medicare Other | Source: Ambulatory Visit | Attending: Family Medicine | Admitting: Family Medicine

## 2021-03-23 DIAGNOSIS — Z1231 Encounter for screening mammogram for malignant neoplasm of breast: Secondary | ICD-10-CM

## 2021-03-28 ENCOUNTER — Ambulatory Visit: Payer: Medicare Other

## 2021-03-31 ENCOUNTER — Ambulatory Visit: Payer: Medicare Other | Admitting: Internal Medicine

## 2021-04-05 ENCOUNTER — Other Ambulatory Visit: Payer: Self-pay

## 2021-04-05 ENCOUNTER — Encounter: Payer: Self-pay | Admitting: Internal Medicine

## 2021-04-05 ENCOUNTER — Ambulatory Visit (INDEPENDENT_AMBULATORY_CARE_PROVIDER_SITE_OTHER): Payer: Medicare Other | Admitting: Internal Medicine

## 2021-04-05 VITALS — BP 122/86 | HR 88 | Temp 98.3°F | Ht 64.0 in | Wt 111.8 lb

## 2021-04-05 DIAGNOSIS — R053 Chronic cough: Secondary | ICD-10-CM | POA: Diagnosis not present

## 2021-04-05 MED ORDER — GABAPENTIN 100 MG PO CAPS: 100.0000 mg | ORAL_CAPSULE | Freq: Three times a day (TID) | ORAL | 3 refills | Status: DC

## 2021-04-05 NOTE — Progress Notes (Signed)
Abigail Hawkins    [id number]    1945-10-27  Primary Care Physician:Abigail, Dwyane Luo, MD Date of Appointment: 04/05/2021 Established Patient Visit  Chief complaint:   Chief Complaint  Patient presents with   Follow-up    Cough unchanged      HPI: Abigail Hawkins is a 75 y.o. woman with history of mild itnermittent asthma, chronic cough.   Interval Updates: Methacholine challenge test was positive. Started on budesonide nebulizer treatments. Those made her cough more. Has since stopped them. Thinks Breo might have helped a long time ago but she isn't sure. Persistent hoarse voice with frequent throat clearing. No albuterol inhaler use MDI/DPI seems to make things worse.     I have reviewed the patient's family social and past medical history and updated as appropriate.   Past Medical History:  Diagnosis Date   Aortic atherosclerosis ([place])    Cataract    CNS cysticercosis    Cough variant asthma    Dysphagia    Fasciculation    High risk medication use    Hoarseness    Hypercholesterolemia    Menopausal flushing    Onychomycosis    Osteoporosis    Other seborrheic dermatitis    Sleep apnea    Trouble swallowing    Vaginal dryness    Vitamin D deficiency     Past Surgical History:  Procedure Laterality Date   ABDOMINAL HYSTERECTOMY     ESOPHAGEAL MANOMETRY N/A 10/30/2018   Procedure: ESOPHAGEAL MANOMETRY (EM);  Surgeon: Abigail Corner, MD;  Location: [facility];  Service: Endoscopy;  Laterality: N/A;   ESOPHAGOGASTRODUODENOSCOPY     EYE MUSCLE SURGERY     LESION REMOVAL     [place] IMPEDANCE STUDY N/A 10/30/2018   Procedure: [place] IMPEDANCE STUDY;  Surgeon: Abigail Corner, MD;  [facility];  Service: Endoscopy;  Laterality: N/A;   TONSILLECTOMY     WISDOM TOOTH EXTRACTION      Family History  Problem Relation Age of Onset   Heart disease Father    Heart disease Brother    Breast cancer Neg Hx     Social History    Occupational History   Not on file  Tobacco Use   Smoking status: Never Smoker   Smokeless tobacco: Never Used  Substance and Sexual Activity   Alcohol use: No   Drug use: No   Sexual activity: Yes     Physical Exam: Blood pressure 122/86, pulse 88, temperature 98.3 F (36.8 C), temperature source Oral, height 5\' 4"  (1.626 m), weight 111 lb 12.8 oz (50.7 kg), SpO2 95 %.  Gen:      No acute distress, frequent coughing and throat clearing ENT:  no nasal polyps, mucus membranes moist Lungs:   Scoliosis, no increased work of breathing, no wheezes or crackles CV:         RRR no mrg   Data Reviewed: Imaging: I have personally reviewed the CT Chest from 2019 which shows air trapping.   PFTs:  PFT Results Latest Ref Rng & Units 01/11/2021 03/12/2018  FVC-Pre L 1.97 1.95  FVC-Predicted Pre % 74 68  FVC-Post L 2.03 1.88  FVC-Predicted Post % 76 65  Pre FEV1/FVC % % 79 77  Post FEV1/FCV % % 78 81  FEV1-Pre L 1.55 1.49  FEV1-Predicted Pre % 78 69  FEV1-Post L 1.58 1.53  DLCO uncorrected ml/min/mmHg - 17.71  DLCO UNC% % - 72  DLCO corrected ml/min/mmHg -  18.17  DLCO COR %Predicted % - 74  DLVA Predicted % - 112  TLC L - 3.89  TLC % Predicted % - 77  RV % Predicted % - 85   I have personally reviewed the patient's PFTs and they show no airflow limitation  Methoacholine challenge test 01/11/21 - POSITIVE. Drop in 23% of FEV1 at 4 mg methacholine  Impendance testing Jan 2020 shows no significant reflux.  Echocardiogram LVEF normal, Grade 1 diastolic dysfunction, no significant valvular disease Nuclear stress EF: 74%. Blood pressure demonstrated a hypertensive response to exercise. There was no ST segment deviation noted during stress. The study is normal. This is a low risk study. The left ventricular ejection fraction is hyperdynamic (>65%).   Normal pharmacologic nuclear stress test with no evidence for prior infarct or ischemia. LVEF 74%.    Labs:  Lab Results   Component Value Date   HGB 12.6 04/29/2018    Immunization status: Immunization History  Administered Date(s) Administered   Influenza, High Dose Seasonal PF 07/13/2017, 06/18/2018   Influenza-Unspecified 06/02/2014, 07/10/2016, 07/08/2019, 07/02/2020   PFIZER(Purple Top)SARS-COV-2 Vaccination 10/07/2019, 10/28/2019, 07/15/2020   Zoster Recombinat (Shingrix) 02/02/2012    Assessment:  Chronic cough - multifactorial Post nasal drainage Irritable larynx Possible asthma? Positive Methacholine challenge test. Not tolerant of any form of ICS. Not steroid responsive.   Plan/Recommendations: Abigail Hawkins has chronic cough with hyperfunctioning laryngeal symptoms - possible irritable larynx?  Stop all nebulizers and MDI/DPI inhalers.  Will start treatment of neurogenic cough with gabapentin 100 mg TID.  In the meantime I think she needs to see laryngology specialist at [facility] or [facility]. I think she would be a good candidate for speech therapy.   Return to Care: Return in about 3 months (around 07/06/2021).   Abigail Llamas, MD Pulmonary and [place]

## 2021-04-05 NOTE — Patient Instructions (Addendum)
The patient should have follow up scheduled with myself in 3 months.   I will refer you to the laryngology (ENT) doctors at [facility] in the meantime.   Start taking gabapentin 100 mg three times a day. Start at night for a week, then increase to twice a day for a week, then go up to three times a day by the third week.   Stop taking all other inhalers for now.

## 2021-04-11 ENCOUNTER — Telehealth: Payer: Self-pay | Admitting: Internal Medicine

## 2021-04-11 NOTE — Telephone Encounter (Signed)
Call made to [facility], they do not have fax. Fax number given [phone].   Call made to patient, confirmed DOB. Made aware the referral will be re-sent. Voiced understanding.   [facility] team please re-send fax to [phone]. Rep stated they have been having problems with their fax and she recommended that we call to be sure that got the fax once sent.   Nothing further needed at this time. Will route message to [facility] team to re-fax referral and confirm fax received.

## 2021-04-11 NOTE — Telephone Encounter (Signed)
Pt is calling in regards to the referral for a laryngology specialist at [facility] or [facility] because she stated that she has not heard anything from anyone stated that it went to Dr. Harrington Challenger but not a Environmental manager and she is not a patient there anymore.   Pls regard; [phone]

## 2021-04-18 ENCOUNTER — Ambulatory Visit
Admission: EM | Admit: 2021-04-18 | Discharge: 2021-04-18 | Disposition: A | Discharge status: Home / Self Care | Payer: Medicare Other

## 2021-04-18 ENCOUNTER — Emergency Department
Admission: EM | Admit: 2021-04-18 | Discharge: 2021-04-18 | Disposition: A | Discharge status: Home / Self Care | Payer: Medicare Other | Attending: Emergency Medicine | Admitting: Emergency Medicine

## 2021-04-18 ENCOUNTER — Encounter: Payer: Self-pay | Admitting: Emergency Medicine

## 2021-04-18 ENCOUNTER — Other Ambulatory Visit: Payer: Self-pay

## 2021-04-18 ENCOUNTER — Emergency Department: Payer: Medicare Other

## 2021-04-18 DIAGNOSIS — W19XXXA Unspecified fall, initial encounter: Secondary | ICD-10-CM

## 2021-04-18 DIAGNOSIS — S0990XA Unspecified injury of head, initial encounter: Secondary | ICD-10-CM

## 2021-04-18 DIAGNOSIS — M47812 Spondylosis without myelopathy or radiculopathy, cervical region: Secondary | ICD-10-CM | POA: Diagnosis not present

## 2021-04-18 DIAGNOSIS — J45909 Unspecified asthma, uncomplicated: Secondary | ICD-10-CM | POA: Insufficient documentation

## 2021-04-18 DIAGNOSIS — Z79899 Other long term (current) drug therapy: Secondary | ICD-10-CM | POA: Insufficient documentation

## 2021-04-18 DIAGNOSIS — Y92009 Unspecified place in unspecified non-institutional (private) residence as the place of occurrence of the external cause: Secondary | ICD-10-CM | POA: Diagnosis not present

## 2021-04-18 DIAGNOSIS — S0003XA Contusion of scalp, initial encounter: Secondary | ICD-10-CM | POA: Insufficient documentation

## 2021-04-18 DIAGNOSIS — Z043 Encounter for examination and observation following other accident: Secondary | ICD-10-CM | POA: Diagnosis not present

## 2021-04-18 DIAGNOSIS — Z7952 Long term (current) use of systemic steroids: Secondary | ICD-10-CM | POA: Insufficient documentation

## 2021-04-18 DIAGNOSIS — W01198A Fall on same level from slipping, tripping and stumbling with subsequent striking against other object, initial encounter: Secondary | ICD-10-CM | POA: Diagnosis not present

## 2021-04-18 NOTE — Discharge Instructions (Addendum)
Your current condition warrants further evaluation and/or treatment which exceed services available to you in this urgent care setting. I have discussed with you your currrent condition and the need for further evaluation and/or treatment in an emergency department setting. In response to my medical recommendation, you have opted to go to the emergency department at: [facility]

## 2021-04-18 NOTE — ED Notes (Signed)
Patient transported to CT 

## 2021-04-18 NOTE — ED Provider Notes (Signed)
[facility] Emergency Department Provider Note   ____________________________________________   I have reviewed the triage vital signs and the nursing notes.   HISTORY  Chief Complaint Head Injury   History limited by: Not Limited   HPI Abigail Hawkins is a 75 y.o. female who presents to the emergency department today after a fall and hitting her head. The patient states that she was putting in a new soap dish in her shower and had to step over the bath ledge. When she did this she missed the bar and fell backwards. Hit her head against a piece of furniture and landed on her buttocks. She did not lose consciousness. Denies any lightheaded, headache, chest pain or palpitations prior to the fall. No recent illness. Has started gabapentin in the past couple of weeks. Did have a small amount of discomfort initially but is pain free at the time of my exam. Did have some initial bleeding.  Records reviewed. Per medical record review patient has a history of being on daily aspirin.   Past Medical History:  Diagnosis Date   Aortic atherosclerosis ([facility])    Cataract    CNS cysticercosis    Cough variant asthma    Dysphagia    Fasciculation    High risk medication use    Hoarseness    Hypercholesterolemia    Menopausal flushing    Onychomycosis    Osteoporosis    Other seborrheic dermatitis    Sleep apnea    Trouble swallowing    Vaginal dryness    Vitamin D deficiency     Patient Active Problem List   Diagnosis Date Noted   Cough 04/29/2018    Past Surgical History:  Procedure Laterality Date   ABDOMINAL HYSTERECTOMY     ESOPHAGEAL MANOMETRY N/A 10/30/2018   Procedure: ESOPHAGEAL MANOMETRY (EM);  Surgeon: Wilford Corner, MD;  Location: [facility];  Service: Endoscopy;  Laterality: N/A;   ESOPHAGOGASTRODUODENOSCOPY     EYE MUSCLE SURGERY     LESION REMOVAL     [place] IMPEDANCE STUDY N/A 10/30/2018   Procedure: [place] IMPEDANCE STUDY;  Surgeon:  Wilford Corner, MD;  Location: [facility];  Service: Endoscopy;  Laterality: N/A;   TONSILLECTOMY     WISDOM TOOTH EXTRACTION      Prior to Admission medications   Medication Sig Start Date End Date Taking? Authorizing Provider  aspirin EC 81 MG tablet Take 81 mg by mouth daily.    [provider]  budesonide (PULMICORT) 0.5 MG/2ML nebulizer solution Take 2 mLs (0.5 mg total) by nebulization in the morning and at bedtime. Patient not taking: Reported on 04/05/2021 03/01/21   Spero Geralds, MD  cetirizine (ZYRTEC) 10 MG tablet Take 10 mg by mouth daily.    [provider]  denosumab (PROLIA) 60 MG/ML SOSY injection Inject 60 mg into the skin every 6 (six) months.    [provider]  gabapentin (NEURONTIN) 100 MG capsule Take 1 capsule (100 mg total) by mouth 3 (three) times daily. 04/05/21   Spero Geralds, MD  lactose free nutrition (BOOST) LIQD See admin instructions.    [provider]  Menaquinone-7 (VITAMIN K2) 100 MCG CAPS See admin instructions.    [provider]  mirabegron ER (MYRBETRIQ) 50 MG TB24 tablet Take 50 mg by mouth daily.    [provider]  rosuvastatin (CRESTOR) 20 MG tablet Take 20 mg by mouth daily.    [provider]  VITAMIN D, CHOLECALCIFEROL, PO Take  by mouth.    [provider]    Allergies Tobramycin sulfate and Penicillin g  Family History  Problem Relation Age of Onset   Heart disease Father    Heart disease Brother    Breast cancer Neg Hx     Social History Social History   Tobacco Use   Smoking status: Never   Smokeless tobacco: Never  Substance Use Topics   Alcohol use: No   Drug use: No    Review of Systems Constitutional: No fever/chills Eyes: No visual changes. ENT: No sore throat. Cardiovascular: Denies chest pain. Respiratory: Denies shortness of breath. Gastrointestinal: No abdominal pain.  No nausea, no vomiting.  No diarrhea.   Genitourinary: Negative  for dysuria. Musculoskeletal: Negative for back pain. Skin: Bleeding from right side of head. Neurological: Negative for headaches, focal weakness or numbness.  ____________________________________________   PHYSICAL EXAM:  VITAL SIGNS: ED Triage Vitals  Enc Vitals Group     BP 04/18/21 1314 (!) 176/74     Pulse Rate 04/18/21 1314 82     Resp 04/18/21 1314 16     Temp 04/18/21 1313 98 F (36.7 C)     Temp Source 04/18/21 1313 Oral     SpO2 04/18/21 1314 97 %     Weight 04/18/21 1310 111 lb 12.4 oz (50.7 kg)     Height 04/18/21 1310 5\' 4"  (1.626 m)     Head Circumference --      Peak Flow --      Pain Score 04/18/21 1310 0   Constitutional: Alert and oriented.  Eyes: Conjunctivae are normal.  ENT      Head: Normocephalic. Hematoma to right side of scalp.      Nose: No congestion/rhinnorhea.      Mouth/Throat: Mucous membranes are moist.      Neck: No stridor. No midline tenderness. Hematological/Lymphatic/Immunilogical: No cervical lymphadenopathy. Cardiovascular: Normal rate, regular rhythm.  No murmurs, rubs, or gallops.  Respiratory: Normal respiratory effort without tachypnea nor retractions. Breath sounds are clear and equal bilaterally. No wheezes/rales/rhonchi. Gastrointestinal: Soft and non tender. No rebound. No guarding.  Genitourinary: Deferred Musculoskeletal: Normal range of motion in all extremities. No spinal tenderness. Neurologic:  Normal speech and language. No gross focal neurologic deficits are appreciated.  Skin:  Small abrasion to right sided scalp hematoma. No bleeding at time of my exam.  Psychiatric: Mood and affect are normal. Speech and behavior are normal. Patient exhibits appropriate insight and judgment.  ____________________________________________    LABS (pertinent positives/negatives)  None  ____________________________________________   EKG  None  ____________________________________________    RADIOLOGY  CT head/cervical  spine No acute osseous abnormality. No acute intracranial abnormality  ____________________________________________   PROCEDURES  Procedures  ____________________________________________   INITIAL IMPRESSION / ASSESSMENT AND PLAN / ED COURSE  Pertinent labs & imaging results that were available during my care of the patient were reviewed by me and considered in my medical decision making (see chart for details).   Patient presented to the emergency department today because of concern for a fall. It does appear to have been a mechanical fall. Head and cervical spine ct without any acute concerning traumatic findings, did show a hematoma which is consistent with physical exam. No active bleeding at this time and no laceration that would necessitate advanced closure. At this time do not feel any blood work is necessary and discussed this with the patient. Will plan on discharging home.   ____________________________________________   FINAL CLINICAL IMPRESSION(S) /  ED DIAGNOSES  Final diagnoses:  Minor head injury, initial encounter  Fall, initial encounter  Hematoma of scalp, initial encounter     Note: This dictation was prepared with Dragon dictation. Any transcriptional errors that result from this process are unintentional     Nance Pear, MD 04/18/21 1422

## 2021-04-18 NOTE — ED Provider Notes (Signed)
CHIEF COMPLAINT:   Chief Complaint  Patient presents with   Fall   Head Laceration     SUBJECTIVE/HPI:   Fall  Head Laceration  A very pleasant 75 y.o.Female presents today with head injury secondary to fall at home.  Patient states that she was trying to put a soap dish in her shower when she fell backwards and landed on her left buttock then the right side of her head hit against molding in the bathroom.  Patient does not report the exact mechanism that caused her fall, but states that she "must have missed the grab bar".  Patient reports profuse bleeding to the right side of her head immediately after injury and swelling to the area.  She does not report any LOC, vomiting, visual changes, weakness or additional injuries sustained from the fall.   has a past medical history of Aortic atherosclerosis ([place]), Cataract, CNS cysticercosis, Cough variant asthma, Dysphagia, Fasciculation, High risk medication use, Hoarseness, Hypercholesterolemia, Menopausal flushing, Onychomycosis, Osteoporosis, Other seborrheic dermatitis, Sleep apnea, Trouble swallowing, Vaginal dryness, and Vitamin D deficiency. ROS:  Review of Systems See Subjective/HPI Medications, Allergies and Problem List personally reviewed in Epic today OBJECTIVE:   Vitals:   04/18/21 1230  BP: (!) 189/91  Pulse: 80  Resp: 18  Temp: 98.2 F (36.8 C)  SpO2: 96%    Physical Exam   General: Appears well-developed and well-nourished. No acute distress.  Patient speaking in full unlabored sentences. HEENT Head: Normocephalic.  Laceration to the right lateral aspect adjacent to the right ear.  There is associated swelling surrounding the area with mild TTP.  No active bleeding at this time. Ears: Hearing grossly intact, no drainage or visible deformity.  Eyes: Conjunctivae and EOM are normal. No eye drainage or scleral icterus bilaterally.  The patient's left pupil is notably larger than the right pupil, but both do dilate.   Patient states that she has a history of chronic cataract of the left eye which causes her pupil to be larger than the right pupil. Neck: Normal range of motion, neck is supple.  Cardiovascular: Normal rate Pulm/Chest: No respiratory distress. Neurological: Alert and oriented to person, place, and time.  CN II through XII intact.  Steady gait noted. Skin: Skin is warm and dry Psychiatric: Normal mood, affect, behavior, and thought content.   Vital signs and nursing note reviewed.   Patient stable and cooperative with examination.  LABS/X-RAYS/EKG/MEDS:   No results found for any visits on 04/18/21.  MEDICAL DECISION MAKING:   Patient presents with head injury secondary to fall at home.  Patient states that she was trying to put a soap dish in her shower when she fell backwards and landed on her left buttock then the right side of her head hit against molding in the bathroom.  Patient does not report the exact mechanism that caused her fall, but states that she "must have missed the grab bar".  Patient reports profuse bleeding to the right side of her head immediately after injury and swelling to the area.  She does not report any LOC, vomiting, visual changes, weakness or additional injuries sustained from the fall.  Chart review completed.  Given symptoms along with assessment findings and [place] head CT scoring, the patient would be best served in the emergency department for further work-up.  The patient is neurologically appropriate in clinic today, but given head injury and daily use of an 81 mg aspirin I feel as if the patient would benefit from imaging  to rule out hematoma.  Patient is stable and able to present to the emergency department via private vehicle with her husband.  Patient verbalized understanding and agreed with plan.  Stable on discharge.  ASSESSMENT/PLAN:  1. Injury of head, initial encounter  2. Fall in home, initial encounter  No orders of the defined types were  placed in this encounter.  Instructions about new medications and side effects provided.  Plan:   Discharge Instructions      Your current condition warrants further evaluation and/or treatment which exceed services available to you in this urgent care setting. I have discussed with you your currrent condition and the need for further evaluation and/or treatment in an emergency department setting. In response to my medical recommendation, you have opted to go to the emergency department at: [facility]       A copy of these instructions have been given to the patient or responsible adult who demonstrated the ability to learn, asked appropriate questions, and verbalized understanding of the plan of care.  There were no barriers to learning identified.   Serafina Royals, FNP-C 04/18/21  This note was partially made with the aid of speech-to-text dictation; typographical errors are not intentional.    Serafina Royals, [place] 04/18/21 1317

## 2021-04-18 NOTE — ED Triage Notes (Signed)
[address] today, hitting head on a piece of furniture.  Patient states she missed her grab bar when falling.  No LOC.  Bleeding controlled.  AAOx3.  Skin warm and dry. NAD

## 2021-04-18 NOTE — ED Notes (Signed)
Patient returned to ED from CT. Patient sitting in chair in room. Patient alert, oriented x4. Respirations even and unlabored. NADN.   Patient reports a fall from standing, hitting her head on piece of furniture. Patient reports heavy bleeding at onset of injury, but reports bleeding is now controlled. Patient noted to have a wound to the right scalp, behind her ear, no active bleeding noted. Patient denies LOC. Patient reports taking 1 81mg  ASA daily, last dose last night. Patient denies dizziness, AMS, CP, or SOB at this time, before, or during fall.

## 2021-04-18 NOTE — Discharge Instructions (Addendum)
Please seek medical attention for any high fevers, chest pain, shortness of breath, change in behavior, persistent vomiting, bloody stool or any other new or concerning symptoms.  

## 2021-04-18 NOTE — ED Notes (Signed)
Dr. Archie Balboa notified of patient HTN. Patient ok for discharge. No new orders received.

## 2021-04-18 NOTE — ED Notes (Signed)
Patient ambulatory to restroom with steady gait.

## 2021-04-18 NOTE — ED Triage Notes (Signed)
Pt presents today along with husband.She reports falling backwards onto buttocks as she was getting into shower this morning. She then struck the right side of her head on piece of furniture. Denies LOC. Bleeding controlled.

## 2021-04-18 NOTE — ED Notes (Signed)
No active bleeding noted to scalp at discharge, but patient provided with wrap to put on at bedtime at her request.

## 2021-04-18 NOTE — ED Notes (Signed)
Patient is resting comfortably. Patient alert, oriented x4. Patient ambulatory with steady gait. Respirations even and unlabored.

## 2021-04-19 ENCOUNTER — Telehealth: Payer: Self-pay | Admitting: Internal Medicine

## 2021-04-19 NOTE — Telephone Encounter (Signed)
Based on description of the fall, I do not think the fall is related to the gabapentin.  Sounds like a mechanical fall based on ED documentation.  I am okay if she goes to 3 times a day tomorrow.

## 2021-04-19 NOTE — Telephone Encounter (Signed)
Spoke with pt who states she is currently taking 2 gababpentin daily and is due to go up to 3 daily tomorrow. She states here cough is well controlled and is wondering if she should increase to 3 a day or just stay and 2 a day. Dr. Silas Flood please advise.

## 2021-04-19 NOTE — Telephone Encounter (Signed)
Called and spoke with patient who states that Referral to [facility] needs to be sent again because they told patient they have no received anything. Advised patient I would send a message to check on this and we would get back to her.   Will route to [facility]

## 2021-04-19 NOTE — Telephone Encounter (Signed)
I am glad to hear it is much better. I would recommend going to 3 times a day to execute the plan laid out by Dr. Shearon Stalls.

## 2021-04-19 NOTE — Telephone Encounter (Signed)
Called and spoke with patient who states that she would like to discuss RX for Gabapentin. States she fell yesterday and had to go to the ED. Supposed to start taking 3 a day today but is going to wait until tomorrow. Wondering if Gabapentin had anything to do with it. States she is on it for a cough that has gotten better.   Dr. Silas Flood please advise on behalf of Dr. Shearon Stalls who is gone till 04/26/21

## 2021-04-19 NOTE — Telephone Encounter (Signed)
Called and spoke with patient, advised her of the recommendations per Dr. Silas Flood.  She verbalized understanding.  She is to stay on it for 3 months and then f/u with Dr. Shearon Stalls (per patient).  Advised patient that referral to [facility] was faxed on 7/6.  Patient states she received a vm that her appointment would be with Theda Sers or Maryln Gottron.  Advised patient to call the phone # that was left to get an update on when she can expect to get an appointment.  She verbalized understanding.  Nothing further needed.

## 2021-04-25 DIAGNOSIS — I350 Nonrheumatic aortic (valve) stenosis: Secondary | ICD-10-CM | POA: Diagnosis not present

## 2021-04-25 DIAGNOSIS — I10 Essential (primary) hypertension: Secondary | ICD-10-CM | POA: Diagnosis not present

## 2021-04-25 NOTE — Telephone Encounter (Signed)
Order refaxed to [facility] and rec'd confirm. Called [facility] and confirmed it was rec'd.  Called pt and pt states they called her and she is sched for 05/09/2021. Nothing further needed.

## 2021-05-02 DIAGNOSIS — Z20822 Contact with and (suspected) exposure to covid-19: Secondary | ICD-10-CM | POA: Diagnosis not present

## 2021-05-09 DIAGNOSIS — J383 Other diseases of vocal cords: Secondary | ICD-10-CM | POA: Diagnosis not present

## 2021-05-09 DIAGNOSIS — R490 Dysphonia: Secondary | ICD-10-CM | POA: Diagnosis not present

## 2021-05-09 DIAGNOSIS — R053 Chronic cough: Secondary | ICD-10-CM | POA: Diagnosis not present

## 2021-05-23 DIAGNOSIS — H0100A Unspecified blepharitis right eye, upper and lower eyelids: Secondary | ICD-10-CM | POA: Diagnosis not present

## 2021-05-23 DIAGNOSIS — H04123 Dry eye syndrome of bilateral lacrimal glands: Secondary | ICD-10-CM | POA: Diagnosis not present

## 2021-05-23 DIAGNOSIS — H2511 Age-related nuclear cataract, right eye: Secondary | ICD-10-CM | POA: Diagnosis not present

## 2021-05-23 DIAGNOSIS — Q120 Congenital cataract: Secondary | ICD-10-CM | POA: Diagnosis not present

## 2021-05-24 DIAGNOSIS — R490 Dysphonia: Secondary | ICD-10-CM | POA: Diagnosis not present

## 2021-05-24 DIAGNOSIS — J383 Other diseases of vocal cords: Secondary | ICD-10-CM | POA: Diagnosis not present

## 2021-05-24 DIAGNOSIS — R053 Chronic cough: Secondary | ICD-10-CM | POA: Diagnosis not present

## 2021-05-26 DIAGNOSIS — D124 Benign neoplasm of descending colon: Secondary | ICD-10-CM | POA: Diagnosis not present

## 2021-05-26 DIAGNOSIS — Z8371 Family history of colonic polyps: Secondary | ICD-10-CM | POA: Diagnosis not present

## 2021-05-26 DIAGNOSIS — Z1211 Encounter for screening for malignant neoplasm of colon: Secondary | ICD-10-CM | POA: Diagnosis not present

## 2021-05-26 DIAGNOSIS — K635 Polyp of colon: Secondary | ICD-10-CM | POA: Diagnosis not present

## 2021-05-26 DIAGNOSIS — K5730 Diverticulosis of large intestine without perforation or abscess without bleeding: Secondary | ICD-10-CM | POA: Diagnosis not present

## 2021-05-26 DIAGNOSIS — K640 First degree hemorrhoids: Secondary | ICD-10-CM | POA: Diagnosis not present

## 2021-05-30 DIAGNOSIS — K635 Polyp of colon: Secondary | ICD-10-CM | POA: Diagnosis not present

## 2021-05-30 DIAGNOSIS — D124 Benign neoplasm of descending colon: Secondary | ICD-10-CM | POA: Diagnosis not present

## 2021-06-02 DIAGNOSIS — Z20822 Contact with and (suspected) exposure to covid-19: Secondary | ICD-10-CM | POA: Diagnosis not present

## 2021-06-07 DIAGNOSIS — R053 Chronic cough: Secondary | ICD-10-CM | POA: Diagnosis not present

## 2021-06-07 DIAGNOSIS — R490 Dysphonia: Secondary | ICD-10-CM | POA: Diagnosis not present

## 2021-06-07 DIAGNOSIS — J383 Other diseases of vocal cords: Secondary | ICD-10-CM | POA: Diagnosis not present

## 2021-06-08 DIAGNOSIS — Z23 Encounter for immunization: Secondary | ICD-10-CM | POA: Diagnosis not present

## 2021-06-15 DIAGNOSIS — R490 Dysphonia: Secondary | ICD-10-CM | POA: Diagnosis not present

## 2021-06-15 DIAGNOSIS — J383 Other diseases of vocal cords: Secondary | ICD-10-CM | POA: Diagnosis not present

## 2021-06-15 DIAGNOSIS — R053 Chronic cough: Secondary | ICD-10-CM | POA: Diagnosis not present

## 2021-06-16 DIAGNOSIS — M810 Age-related osteoporosis without current pathological fracture: Secondary | ICD-10-CM | POA: Diagnosis not present

## 2021-06-16 DIAGNOSIS — R5383 Other fatigue: Secondary | ICD-10-CM | POA: Diagnosis not present

## 2021-06-16 DIAGNOSIS — E559 Vitamin D deficiency, unspecified: Secondary | ICD-10-CM | POA: Diagnosis not present

## 2021-06-23 DIAGNOSIS — R490 Dysphonia: Secondary | ICD-10-CM | POA: Diagnosis not present

## 2021-06-23 DIAGNOSIS — J383 Other diseases of vocal cords: Secondary | ICD-10-CM | POA: Diagnosis not present

## 2021-06-23 DIAGNOSIS — R053 Chronic cough: Secondary | ICD-10-CM | POA: Diagnosis not present

## 2021-06-27 DIAGNOSIS — R053 Chronic cough: Secondary | ICD-10-CM | POA: Diagnosis not present

## 2021-06-27 DIAGNOSIS — J383 Other diseases of vocal cords: Secondary | ICD-10-CM | POA: Diagnosis not present

## 2021-06-27 DIAGNOSIS — R490 Dysphonia: Secondary | ICD-10-CM | POA: Diagnosis not present

## 2021-07-05 DIAGNOSIS — M810 Age-related osteoporosis without current pathological fracture: Secondary | ICD-10-CM | POA: Diagnosis not present

## 2021-07-05 DIAGNOSIS — E559 Vitamin D deficiency, unspecified: Secondary | ICD-10-CM | POA: Diagnosis not present

## 2021-07-06 ENCOUNTER — Other Ambulatory Visit: Payer: Self-pay | Admitting: Sports Medicine

## 2021-07-06 DIAGNOSIS — M810 Age-related osteoporosis without current pathological fracture: Secondary | ICD-10-CM

## 2021-07-08 DIAGNOSIS — Z23 Encounter for immunization: Secondary | ICD-10-CM | POA: Diagnosis not present

## 2021-07-12 ENCOUNTER — Ambulatory Visit (INDEPENDENT_AMBULATORY_CARE_PROVIDER_SITE_OTHER): Payer: Medicare Other | Admitting: Internal Medicine

## 2021-07-12 ENCOUNTER — Encounter: Payer: Self-pay | Admitting: Internal Medicine

## 2021-07-12 ENCOUNTER — Other Ambulatory Visit: Payer: Self-pay

## 2021-07-12 VITALS — BP 110/70 | HR 77 | Temp 98.3°F | Ht 63.0 in | Wt 114.4 lb

## 2021-07-12 DIAGNOSIS — R053 Chronic cough: Secondary | ICD-10-CM

## 2021-07-12 MED ORDER — GABAPENTIN 100 MG PO CAPS: 200.0000 mg | ORAL_CAPSULE | Freq: Three times a day (TID) | ORAL | 3 refills | Status: DC

## 2021-07-12 NOTE — Patient Instructions (Addendum)
Please schedule follow up scheduled with myself in 4 months.  If my schedule is not open yet, we will contact you with a reminder closer to that time.  Increase gabapentin to 200 mg at night time.  Then increase the afternoon dose to 200 mg after three days. Then increase the morning dose to 200 mg after three days.  Watch for dizziness and sleepiness. This tends to get better over a few days.

## 2021-07-12 NOTE — Progress Notes (Signed)
Abigail Hawkins    [id number]    1945/10/29  Primary Care Physician:Babaoff, Beverely Low, MD Date of Appointment: 07/12/2021 Established Patient Visit  Chief complaint:   Chief Complaint  Patient presents with   Follow-up    Cough not improved     HPI: Abigail Hawkins is a 75 y.o. woman with history of mild itnermittent asthma, chronic cough. Felt to be hyperfunctioning larynx and neurogenic cough.   Interval Updates: Has seen ENT at [facility] in [facility] [place] and is going to ongoing speech therapy.  Has stopped clearing her throat as much. Focusing on belly breathing. Cough is improved. Still comes up when she tries to talk and laugh at the same time. On gabapentin 100 mg three times a day. She is hesitant to uptitrate.   I have reviewed the patient's family social and past medical history and updated as appropriate.   Past Medical History:  Diagnosis Date   Aortic atherosclerosis ([place])    Cataract    CNS cysticercosis    Cough variant asthma    Dysphagia    Fasciculation    High risk medication use    Hoarseness    Hypercholesterolemia    Menopausal flushing    Onychomycosis    Osteoporosis    Other seborrheic dermatitis    Sleep apnea    Trouble swallowing    Vaginal dryness    Vitamin D deficiency     Past Surgical History:  Procedure Laterality Date   ABDOMINAL HYSTERECTOMY     ESOPHAGEAL MANOMETRY N/A 10/30/2018   Procedure: ESOPHAGEAL MANOMETRY (EM);  Surgeon: Wilford Corner, MD;  Location: [facility];  Service: Endoscopy;  Laterality: N/A;   ESOPHAGOGASTRODUODENOSCOPY     EYE MUSCLE SURGERY     LESION REMOVAL     [place] IMPEDANCE STUDY N/A 10/30/2018   Procedure: [place] IMPEDANCE STUDY;  Surgeon: Wilford Corner, MD;  Location: [facility];  Service: Endoscopy;  Laterality: N/A;   TONSILLECTOMY     WISDOM TOOTH EXTRACTION      Family History  Problem Relation Age of Onset   Heart disease Father    Heart disease Brother    Breast cancer  Neg Hx     Social History   Occupational History   Not on file  Tobacco Use   Smoking status: Never   Smokeless tobacco: Never  Substance and Sexual Activity   Alcohol use: No   Drug use: No   Sexual activity: Yes     Physical Exam: Blood pressure 110/70, pulse 77, temperature 98.3 F (36.8 C), temperature source Oral, height 5\' 3"  (1.6 m), weight 114 lb 6.4 oz (51.9 kg), SpO2 99 %.  Gen:      No acute distress,minimal coughing.  ENT:  no nasal polyps, mucus membranes moist, hoarse voice Lungs:   Scoliosis, no increased work of breathing, no wheezes or crackles CV:         RRR no mrg   Data Reviewed: Imaging: I have personally reviewed the CT Chest from 2019 which shows air trapping.   PFTs:   PFT Results Latest Ref Rng & Units 01/11/2021 03/12/2018  FVC-Pre L 1.97 1.95  FVC-Predicted Pre % 74 68  FVC-Post L 2.03 1.88  FVC-Predicted Post % 76 65  Pre FEV1/FVC % % 79 77  Post FEV1/FCV % % 78 81  FEV1-Pre L 1.55 1.49  FEV1-Predicted Pre % 78 69  FEV1-Post L 1.58 1.53  DLCO uncorrected ml/min/mmHg - 17.71  DLCO UNC% % - 72  DLCO corrected ml/min/mmHg - 18.17  DLCO COR %Predicted % - 74  DLVA Predicted % - 112  TLC L - 3.89  TLC % Predicted % - 77  RV % Predicted % - 85   I have personally reviewed the patient's PFTs and they show no airflow limitation  Methoacholine challenge test 01/11/21 - POSITIVE. Drop in 23% of FEV1 at 4 mg methacholine  Impendance testing Jan 2020 shows no significant reflux.  Echocardiogram LVEF normal, Grade 1 diastolic dysfunction, no significant valvular disease Nuclear stress EF: 74%. Blood pressure demonstrated a hypertensive response to exercise. There was no ST segment deviation noted during stress. The study is normal. This is a low risk study. The left ventricular ejection fraction is hyperdynamic (>65%).   Normal pharmacologic nuclear stress test with no evidence for prior infarct or ischemia. LVEF 74%.    Labs:  Lab  Results  Component Value Date   HGB 12.6 04/29/2018    Immunization status: Immunization History  Administered Date(s) Administered   Fluad Quad(high Dose 65+) 07/08/2021   Influenza, High Dose Seasonal PF 07/10/2016, 07/13/2017, 06/18/2018, 07/08/2019   Influenza-Unspecified 06/02/2014, 07/10/2016, 07/08/2019, 07/02/2020   PFIZER(Purple Top)SARS-COV-2 Vaccination 10/07/2019, 10/28/2019, 07/15/2020, 01/28/2021   Pneumococcal Conjugate-13 06/01/2014   Pneumococcal Polysaccharide-23 11/08/2009   Td 07/10/2016   Tdap 10/25/2005   Zoster Recombinat (Shingrix) 02/02/2012    Assessment:  Chronic cough - multifactorial secondary to irritable larynx syndrome  Post nasal drainage Irritable larynx Possible asthma? Positive Methacholine challenge test. Not tolerant of any form of ICS. Not steroid responsive.   Plan/Recommendations: Continue gabapentin. Uptitrate to 200 mg TID. Instructions given on this. Refilled.  Continue to abstain from inhaler therapy. Continue to follow up with slp therapy and ENT as needed.   Has gotten flu shot  Return to Care: Return in about 4 months (around 11/12/2021).   Lenice Llamas, MD Pulmonary and [place]

## 2021-07-20 DIAGNOSIS — R490 Dysphonia: Secondary | ICD-10-CM | POA: Diagnosis not present

## 2021-07-20 DIAGNOSIS — J383 Other diseases of vocal cords: Secondary | ICD-10-CM | POA: Diagnosis not present

## 2021-07-26 ENCOUNTER — Ambulatory Visit
Admission: RE | Admit: 2021-07-26 | Discharge: 2021-07-26 | Disposition: A | Discharge status: Home / Self Care | Payer: Medicare Other | Source: Ambulatory Visit | Attending: Sports Medicine | Admitting: Sports Medicine

## 2021-07-26 ENCOUNTER — Other Ambulatory Visit: Payer: Self-pay

## 2021-07-26 DIAGNOSIS — M810 Age-related osteoporosis without current pathological fracture: Secondary | ICD-10-CM | POA: Insufficient documentation

## 2021-07-26 DIAGNOSIS — Z780 Asymptomatic menopausal state: Secondary | ICD-10-CM | POA: Diagnosis not present

## 2021-07-26 DIAGNOSIS — M85852 Other specified disorders of bone density and structure, left thigh: Secondary | ICD-10-CM | POA: Diagnosis not present

## 2021-08-01 DIAGNOSIS — R053 Chronic cough: Secondary | ICD-10-CM | POA: Diagnosis not present

## 2021-08-01 DIAGNOSIS — R490 Dysphonia: Secondary | ICD-10-CM | POA: Diagnosis not present

## 2021-08-01 DIAGNOSIS — J310 Chronic rhinitis: Secondary | ICD-10-CM | POA: Diagnosis not present

## 2021-08-01 DIAGNOSIS — J383 Other diseases of vocal cords: Secondary | ICD-10-CM | POA: Diagnosis not present

## 2021-08-04 DIAGNOSIS — R053 Chronic cough: Secondary | ICD-10-CM | POA: Diagnosis not present

## 2021-08-04 DIAGNOSIS — R490 Dysphonia: Secondary | ICD-10-CM | POA: Diagnosis not present

## 2021-08-04 DIAGNOSIS — J383 Other diseases of vocal cords: Secondary | ICD-10-CM | POA: Diagnosis not present

## 2021-08-12 ENCOUNTER — Other Ambulatory Visit: Payer: Self-pay | Admitting: Internal Medicine

## 2021-09-01 DIAGNOSIS — R053 Chronic cough: Secondary | ICD-10-CM | POA: Diagnosis not present

## 2021-09-01 DIAGNOSIS — J383 Other diseases of vocal cords: Secondary | ICD-10-CM | POA: Diagnosis not present

## 2021-09-01 DIAGNOSIS — R490 Dysphonia: Secondary | ICD-10-CM | POA: Diagnosis not present

## 2021-09-12 DIAGNOSIS — I700 Atherosclerosis of aorta: Secondary | ICD-10-CM | POA: Diagnosis not present

## 2021-09-12 DIAGNOSIS — Z79899 Other long term (current) drug therapy: Secondary | ICD-10-CM | POA: Diagnosis not present

## 2021-09-15 DIAGNOSIS — N3941 Urge incontinence: Secondary | ICD-10-CM | POA: Diagnosis not present

## 2021-09-15 DIAGNOSIS — R350 Frequency of micturition: Secondary | ICD-10-CM | POA: Diagnosis not present

## 2021-09-17 DIAGNOSIS — Z20828 Contact with and (suspected) exposure to other viral communicable diseases: Secondary | ICD-10-CM | POA: Diagnosis not present

## 2021-09-19 DIAGNOSIS — E785 Hyperlipidemia, unspecified: Secondary | ICD-10-CM | POA: Diagnosis not present

## 2021-09-19 DIAGNOSIS — Z1331 Encounter for screening for depression: Secondary | ICD-10-CM | POA: Diagnosis not present

## 2021-09-19 DIAGNOSIS — I700 Atherosclerosis of aorta: Secondary | ICD-10-CM | POA: Diagnosis not present

## 2021-09-19 DIAGNOSIS — Z0000 Encounter for general adult medical examination without abnormal findings: Secondary | ICD-10-CM | POA: Diagnosis not present

## 2021-09-19 DIAGNOSIS — R053 Chronic cough: Secondary | ICD-10-CM | POA: Diagnosis not present

## 2021-09-19 DIAGNOSIS — Z79899 Other long term (current) drug therapy: Secondary | ICD-10-CM | POA: Diagnosis not present

## 2021-09-19 DIAGNOSIS — M810 Age-related osteoporosis without current pathological fracture: Secondary | ICD-10-CM | POA: Diagnosis not present

## 2021-09-28 ENCOUNTER — Other Ambulatory Visit: Payer: Self-pay | Admitting: Internal Medicine

## 2021-11-10 ENCOUNTER — Ambulatory Visit (INDEPENDENT_AMBULATORY_CARE_PROVIDER_SITE_OTHER): Payer: Medicare Other | Admitting: Internal Medicine

## 2021-11-10 ENCOUNTER — Other Ambulatory Visit: Payer: Self-pay

## 2021-11-10 ENCOUNTER — Encounter: Payer: Self-pay | Admitting: Internal Medicine

## 2021-11-10 VITALS — BP 128/78 | HR 84 | Temp 98.0°F | Ht 63.5 in | Wt 116.8 lb

## 2021-11-10 DIAGNOSIS — R053 Chronic cough: Secondary | ICD-10-CM

## 2021-11-10 MED ORDER — GABAPENTIN 100 MG PO CAPS: 200.0000 mg | ORAL_CAPSULE | Freq: Three times a day (TID) | ORAL | 5 refills | Status: DC

## 2021-11-10 MED ORDER — GABAPENTIN 100 MG PO CAPS
200.0000 mg | ORAL_CAPSULE | Freq: Three times a day (TID) | ORAL | 11 refills | Status: DC
Start: 2021-11-10 — End: 2022-05-31

## 2021-11-10 NOTE — Progress Notes (Signed)
Bellamarie Pflug    [id number]    02/21/1946  Primary Care Physician:Babaoff, Beverely Low, MD Date of Appointment: 11/10/2021 Established Patient Visit  Chief complaint:   Chief Complaint  Patient presents with   Follow-up    Questions about gabaplentin lower dosage      HPI: Abigail Hawkins is a 76 y.o. woman with history of mild itnermittent asthma, chronic cough. Felt to be hyperfunctioning larynx and neurogenic cough.   Interval Updates:  Still has cough sometimes when she gets excited talking. On 200 mg TID for gabapentin which has helped the cough be less intrusive. When she goes from a bending over to standing up position (playing pickleball) she feels she has fuzzy sight which takes a few seconds to correct.  Specifically denies lightheadedness and dizziness.     I have reviewed the patient's family social and past medical history and updated as appropriate.   Past Medical History:  Diagnosis Date   Aortic atherosclerosis ([place])    Cataract    CNS cysticercosis    Cough variant asthma    Dysphagia    Fasciculation    High risk medication use    Hoarseness    Hypercholesterolemia    Menopausal flushing    Onychomycosis    Osteoporosis    Other seborrheic dermatitis    Sleep apnea    Trouble swallowing    Vaginal dryness    Vitamin D deficiency     Past Surgical History:  Procedure Laterality Date   ABDOMINAL HYSTERECTOMY     ESOPHAGEAL MANOMETRY N/A 10/30/2018   Procedure: ESOPHAGEAL MANOMETRY (EM);  Surgeon: Wilford Corner, MD;  Location: [facility];  Service: Endoscopy;  Laterality: N/A;   ESOPHAGOGASTRODUODENOSCOPY     EYE MUSCLE SURGERY     LESION REMOVAL     [place] IMPEDANCE STUDY N/A 10/30/2018   Procedure: [place] IMPEDANCE STUDY;  Surgeon: Wilford Corner, MD;  Location[facility];  Service: Endoscopy;  Laterality: N/A;   TONSILLECTOMY     WISDOM TOOTH EXTRACTION      Family History  Problem Relation Age of Onset   Heart disease  Father    Heart disease Brother    Breast cancer Neg Hx     Social History   Occupational History   Not on file  Tobacco Use   Smoking status: Never   Smokeless tobacco: Never  Substance and Sexual Activity   Alcohol use: No   Drug use: No   Sexual activity: Yes     Physical Exam: Blood pressure 128/78, pulse 84, temperature 98 F (36.7 C), temperature source Oral, height 5' 3.5" (1.613 m), weight 116 lb 12.8 oz (53 kg), SpO2 96 %.  Gen:      No acute distress, hoarse voice ENT:  mmm no polyps Lungs:   ctab no wheezes or crackles, scoliosis CV:         RRR no mrg   Data Reviewed: Imaging: I have personally reviewed the CT Chest from 2019 which shows air trapping.   PFTs:   PFT Results Latest Ref Rng & Units 01/11/2021 03/12/2018  FVC-Pre L 1.97 1.95  FVC-Predicted Pre % 74 68  FVC-Post L 2.03 1.88  FVC-Predicted Post % 76 65  Pre FEV1/FVC % % 79 77  Post FEV1/FCV % % 78 81  FEV1-Pre L 1.55 1.49  FEV1-Predicted Pre % 78 69  FEV1-Post L 1.58 1.53  DLCO uncorrected ml/min/mmHg - 17.71  DLCO UNC% % -  72  DLCO corrected ml/min/mmHg - 18.17  DLCO COR %Predicted % - 74  DLVA Predicted % - 112  TLC L - 3.89  TLC % Predicted % - 77  RV % Predicted % - 85   I have personally reviewed the patient's PFTs and they show no airflow limitation  Methoacholine challenge test 01/11/21 - POSITIVE. Drop in 23% of FEV1 at 4 mg methacholine  Impendance testing Jan 2020 shows no significant reflux.  Echocardiogram LVEF normal, Grade 1 diastolic dysfunction, no significant valvular disease Nuclear stress EF: 74%. Blood pressure demonstrated a hypertensive response to exercise. There was no ST segment deviation noted during stress. The study is normal. This is a low risk study. The left ventricular ejection fraction is hyperdynamic (>65%).   Normal pharmacologic nuclear stress test with no evidence for prior infarct or ischemia. LVEF 74%.    Labs:  Lab Results  Component  Value Date   HGB 12.6 04/29/2018    Immunization status: Immunization History  Administered Date(s) Administered   Fluad Quad(high Dose 65+) 07/08/2021   Influenza Split 06/30/2013, 06/27/2014, 07/13/2017, 06/18/2018   Influenza, High Dose Seasonal PF 07/10/2016, 07/13/2017, 06/18/2018, 07/08/2019   Influenza-Unspecified 06/02/2014, 06/08/2015, 07/10/2016, 07/08/2019, 07/02/2020   PFIZER(Purple Top)SARS-COV-2 Vaccination 10/07/2019, 10/28/2019, 07/15/2020, 01/28/2021   Pneumococcal Conjugate-13 06/01/2014   Pneumococcal Polysaccharide-23 11/08/2009   Td 07/10/2016   Tdap 10/25/2005   Zoster Recombinat (Shingrix) 02/02/2012   Zoster, Live 02/02/2012, 09/17/2020, 11/30/2020    Assessment:  Chronic cough - multifactorial secondary to irritable larynx syndrome, neurogenic cough Post nasal drainage - new, worsening Positive Methacholine challenge test. Not tolerant of any form of ICS. Not steroid responsive.   Plan/Recommendations: Having possible adverse reaction to the gabapentin - try coming down on morning and afternoon dose to help with the daytime symptoms  Recommend starting flonase nasal spray to help with whatever residual cough is related to the post nasal drainage  Return to Care: Return in about 1 year (around 11/10/2022).   Lenice Llamas, MD Pulmonary and [place]

## 2021-11-10 NOTE — Patient Instructions (Addendum)
Please schedule follow up scheduled with myself in 12 months.  If my schedule is not open yet, we will contact you with a reminder closer to that time. Please call [phone] if you haven't heard from [place] a month before.   Titrate your gabapentin every 3 days as needed to effect - lowering the dose may improve your sleepiness, but be sure to watch for worsening cough.   Flonase (fluticasone) - 1 spray on each side of your nose twice a day for first week, then 1 spray on each side.   Instructions for use: If you also use a saline nasal spray or rinse, use that first. Position the head with the chin slightly tucked. Use the right hand to spray into the left nostril and the right hand to spray into the left nostril.   Point the bottle away from the septum of your nose (cartilage that divides the two sides of your nose).  Hold the nostril closed on the opposite side from where you will spray Spray once and gently sniff to pull the medicine into the higher parts of your nose.  Don't sniff too hard as the medicine will drain down the back of your throat instead. Repeat with a second spray on the same side if prescribed. Repeat on the other side of your nose.

## 2021-11-25 DIAGNOSIS — H18593 Other hereditary corneal dystrophies, bilateral: Secondary | ICD-10-CM | POA: Diagnosis not present

## 2021-11-25 DIAGNOSIS — H04123 Dry eye syndrome of bilateral lacrimal glands: Secondary | ICD-10-CM | POA: Diagnosis not present

## 2021-11-25 DIAGNOSIS — H5203 Hypermetropia, bilateral: Secondary | ICD-10-CM | POA: Diagnosis not present

## 2021-11-25 DIAGNOSIS — H2511 Age-related nuclear cataract, right eye: Secondary | ICD-10-CM | POA: Diagnosis not present

## 2021-12-07 DIAGNOSIS — M2012 Hallux valgus (acquired), left foot: Secondary | ICD-10-CM | POA: Diagnosis not present

## 2021-12-07 DIAGNOSIS — L84 Corns and callosities: Secondary | ICD-10-CM | POA: Diagnosis not present

## 2021-12-07 DIAGNOSIS — M2042 Other hammer toe(s) (acquired), left foot: Secondary | ICD-10-CM | POA: Diagnosis not present

## 2021-12-07 DIAGNOSIS — M2041 Other hammer toe(s) (acquired), right foot: Secondary | ICD-10-CM | POA: Diagnosis not present

## 2021-12-07 DIAGNOSIS — M2011 Hallux valgus (acquired), right foot: Secondary | ICD-10-CM | POA: Diagnosis not present

## 2021-12-20 DIAGNOSIS — L821 Other seborrheic keratosis: Secondary | ICD-10-CM | POA: Diagnosis not present

## 2021-12-20 DIAGNOSIS — Z85828 Personal history of other malignant neoplasm of skin: Secondary | ICD-10-CM | POA: Diagnosis not present

## 2021-12-20 DIAGNOSIS — D2271 Melanocytic nevi of right lower limb, including hip: Secondary | ICD-10-CM | POA: Diagnosis not present

## 2021-12-20 DIAGNOSIS — Z23 Encounter for immunization: Secondary | ICD-10-CM | POA: Diagnosis not present

## 2021-12-20 DIAGNOSIS — L309 Dermatitis, unspecified: Secondary | ICD-10-CM | POA: Diagnosis not present

## 2021-12-20 DIAGNOSIS — L578 Other skin changes due to chronic exposure to nonionizing radiation: Secondary | ICD-10-CM | POA: Diagnosis not present

## 2021-12-22 DIAGNOSIS — E559 Vitamin D deficiency, unspecified: Secondary | ICD-10-CM | POA: Diagnosis not present

## 2021-12-22 DIAGNOSIS — M810 Age-related osteoporosis without current pathological fracture: Secondary | ICD-10-CM | POA: Diagnosis not present

## 2021-12-22 DIAGNOSIS — R5383 Other fatigue: Secondary | ICD-10-CM | POA: Diagnosis not present

## 2022-01-04 DIAGNOSIS — M810 Age-related osteoporosis without current pathological fracture: Secondary | ICD-10-CM | POA: Diagnosis not present

## 2022-01-04 DIAGNOSIS — E559 Vitamin D deficiency, unspecified: Secondary | ICD-10-CM | POA: Diagnosis not present

## 2022-01-19 DIAGNOSIS — Z23 Encounter for immunization: Secondary | ICD-10-CM | POA: Diagnosis not present

## 2022-01-23 DIAGNOSIS — Z20822 Contact with and (suspected) exposure to covid-19: Secondary | ICD-10-CM | POA: Diagnosis not present

## 2022-02-09 ENCOUNTER — Other Ambulatory Visit: Payer: Self-pay | Admitting: Family Medicine

## 2022-02-09 DIAGNOSIS — Z1231 Encounter for screening mammogram for malignant neoplasm of breast: Secondary | ICD-10-CM

## 2022-02-16 DIAGNOSIS — H25011 Cortical age-related cataract, right eye: Secondary | ICD-10-CM | POA: Diagnosis not present

## 2022-02-16 DIAGNOSIS — H18591 Other hereditary corneal dystrophies, right eye: Secondary | ICD-10-CM | POA: Diagnosis not present

## 2022-02-16 DIAGNOSIS — H2511 Age-related nuclear cataract, right eye: Secondary | ICD-10-CM | POA: Diagnosis not present

## 2022-02-16 DIAGNOSIS — H25811 Combined forms of age-related cataract, right eye: Secondary | ICD-10-CM | POA: Diagnosis not present

## 2022-03-13 DIAGNOSIS — Z79899 Other long term (current) drug therapy: Secondary | ICD-10-CM | POA: Diagnosis not present

## 2022-03-13 DIAGNOSIS — E7800 Pure hypercholesterolemia, unspecified: Secondary | ICD-10-CM | POA: Diagnosis not present

## 2022-03-20 DIAGNOSIS — I10 Essential (primary) hypertension: Secondary | ICD-10-CM | POA: Diagnosis not present

## 2022-03-20 DIAGNOSIS — Z79899 Other long term (current) drug therapy: Secondary | ICD-10-CM | POA: Diagnosis not present

## 2022-03-20 DIAGNOSIS — E785 Hyperlipidemia, unspecified: Secondary | ICD-10-CM | POA: Diagnosis not present

## 2022-03-20 DIAGNOSIS — N3941 Urge incontinence: Secondary | ICD-10-CM | POA: Diagnosis not present

## 2022-03-20 DIAGNOSIS — R053 Chronic cough: Secondary | ICD-10-CM | POA: Diagnosis not present

## 2022-03-20 DIAGNOSIS — D696 Thrombocytopenia, unspecified: Secondary | ICD-10-CM | POA: Diagnosis not present

## 2022-03-20 DIAGNOSIS — M810 Age-related osteoporosis without current pathological fracture: Secondary | ICD-10-CM | POA: Diagnosis not present

## 2022-03-28 ENCOUNTER — Ambulatory Visit
Admission: RE | Admit: 2022-03-28 | Discharge: 2022-03-28 | Disposition: A | Discharge status: Home / Self Care | Payer: Medicare Other | Source: Ambulatory Visit | Attending: Family Medicine | Admitting: Family Medicine

## 2022-03-28 DIAGNOSIS — Z1231 Encounter for screening mammogram for malignant neoplasm of breast: Secondary | ICD-10-CM

## 2022-03-29 DIAGNOSIS — H18591 Other hereditary corneal dystrophies, right eye: Secondary | ICD-10-CM | POA: Diagnosis not present

## 2022-05-22 DIAGNOSIS — M810 Age-related osteoporosis without current pathological fracture: Secondary | ICD-10-CM | POA: Diagnosis not present

## 2022-05-22 DIAGNOSIS — R5383 Other fatigue: Secondary | ICD-10-CM | POA: Diagnosis not present

## 2022-05-22 DIAGNOSIS — E559 Vitamin D deficiency, unspecified: Secondary | ICD-10-CM | POA: Diagnosis not present

## 2022-05-22 LAB — LAB REPORT - SCANNED: EGFR: 64

## 2022-05-29 ENCOUNTER — Telehealth: Payer: Self-pay | Admitting: Internal Medicine

## 2022-05-31 MED ORDER — GABAPENTIN 100 MG PO CAPS: 200.0000 mg | ORAL_CAPSULE | Freq: Three times a day (TID) | ORAL | 11 refills | Status: DC

## 2022-05-31 NOTE — Telephone Encounter (Signed)
I called the patient and let her know that a refill has been sent to the pharmacy. Nothing further needed.

## 2022-07-07 DIAGNOSIS — Z23 Encounter for immunization: Secondary | ICD-10-CM | POA: Diagnosis not present

## 2022-07-12 DIAGNOSIS — M810 Age-related osteoporosis without current pathological fracture: Secondary | ICD-10-CM | POA: Diagnosis not present

## 2022-07-12 DIAGNOSIS — M160 Bilateral primary osteoarthritis of hip: Secondary | ICD-10-CM | POA: Diagnosis not present

## 2022-07-17 DIAGNOSIS — H18593 Other hereditary corneal dystrophies, bilateral: Secondary | ICD-10-CM | POA: Diagnosis not present

## 2022-07-17 DIAGNOSIS — H04123 Dry eye syndrome of bilateral lacrimal glands: Secondary | ICD-10-CM | POA: Diagnosis not present

## 2022-07-17 DIAGNOSIS — H02831 Dermatochalasis of right upper eyelid: Secondary | ICD-10-CM | POA: Diagnosis not present

## 2022-07-17 DIAGNOSIS — H02834 Dermatochalasis of left upper eyelid: Secondary | ICD-10-CM | POA: Diagnosis not present

## 2022-09-12 DIAGNOSIS — I700 Atherosclerosis of aorta: Secondary | ICD-10-CM | POA: Diagnosis not present

## 2022-09-12 DIAGNOSIS — Z79899 Other long term (current) drug therapy: Secondary | ICD-10-CM | POA: Diagnosis not present

## 2022-09-12 DIAGNOSIS — E7800 Pure hypercholesterolemia, unspecified: Secondary | ICD-10-CM | POA: Diagnosis not present

## 2022-09-13 DIAGNOSIS — R351 Nocturia: Secondary | ICD-10-CM | POA: Diagnosis not present

## 2022-09-13 DIAGNOSIS — N3941 Urge incontinence: Secondary | ICD-10-CM | POA: Diagnosis not present

## 2022-09-19 DIAGNOSIS — Z79899 Other long term (current) drug therapy: Secondary | ICD-10-CM | POA: Diagnosis not present

## 2022-09-19 DIAGNOSIS — I700 Atherosclerosis of aorta: Secondary | ICD-10-CM | POA: Diagnosis not present

## 2022-09-19 DIAGNOSIS — Z0000 Encounter for general adult medical examination without abnormal findings: Secondary | ICD-10-CM | POA: Diagnosis not present

## 2022-09-19 DIAGNOSIS — M810 Age-related osteoporosis without current pathological fracture: Secondary | ICD-10-CM | POA: Diagnosis not present

## 2022-09-19 DIAGNOSIS — N3941 Urge incontinence: Secondary | ICD-10-CM | POA: Diagnosis not present

## 2022-09-19 DIAGNOSIS — R053 Chronic cough: Secondary | ICD-10-CM | POA: Diagnosis not present

## 2022-09-19 DIAGNOSIS — E785 Hyperlipidemia, unspecified: Secondary | ICD-10-CM | POA: Diagnosis not present

## 2022-09-19 DIAGNOSIS — I10 Essential (primary) hypertension: Secondary | ICD-10-CM | POA: Diagnosis not present

## 2022-09-19 DIAGNOSIS — Z1331 Encounter for screening for depression: Secondary | ICD-10-CM | POA: Diagnosis not present

## 2022-10-17 ENCOUNTER — Ambulatory Visit
Admission: EM | Admit: 2022-10-17 | Discharge: 2022-10-17 | Disposition: A | Discharge status: Home / Self Care | Payer: Medicare Other | Attending: Urgent Care | Admitting: Urgent Care

## 2022-10-17 DIAGNOSIS — B029 Zoster without complications: Secondary | ICD-10-CM | POA: Diagnosis not present

## 2022-10-17 MED ORDER — VALACYCLOVIR HCL 1 G PO TABS: 1000.0000 mg | ORAL_TABLET | Freq: Three times a day (TID) | ORAL | 0 refills | Status: DC

## 2022-10-17 NOTE — Discharge Instructions (Addendum)
Follow up here or with your primary care provider if your symptoms are worsening or not improving.     

## 2022-10-17 NOTE — ED Triage Notes (Signed)
Pt presents with red marks on face.  Three strips beside left eye and patchy area on L cheek.  States she noticed it just after having her hair cut today.  Not painful or itchy but she can tell it's there.  No chemicals used on her hair that she is aware of.   Area on cheek has started to spread to neck since she first noticed.  Denies involvement with throat/airway.

## 2022-10-17 NOTE — ED Provider Notes (Addendum)
Abigail Hawkins    CSN: [id number] Arrival date & time: 10/17/22  1635      History   Chief Complaint Chief Complaint  Patient presents with   Rash    HPI Abigail Hawkins is a 77 y.o. female.    Rash   Patient presents with rash and concern for shingles outbreak.  Chart indicates Shingrix given 02/02/2012.  Live zoster given 09/17/2020 and 11/30/2020 per chart.  Patient states that 2021 and 2022 documentation is an accurate as these were given at the pharmacy and were for Shingrix.  Past Medical History:  Diagnosis Date   Aortic atherosclerosis ([facility])    Cataract    CNS cysticercosis    Cough variant asthma    Dysphagia    Fasciculation    High risk medication use    Hoarseness    Hypercholesterolemia    Menopausal flushing    Onychomycosis    Osteoporosis    Other seborrheic dermatitis    Sleep apnea    Trouble swallowing    Vaginal dryness    Vitamin D deficiency     Patient Active Problem List   Diagnosis Date Noted   Cough 04/29/2018    Past Surgical History:  Procedure Laterality Date   ABDOMINAL HYSTERECTOMY     ESOPHAGEAL MANOMETRY N/A 10/30/2018   Procedure: ESOPHAGEAL MANOMETRY (EM);  Surgeon: Wilford Corner, MD;  Location: [facility];  Service: Endoscopy;  Laterality: N/A;   ESOPHAGOGASTRODUODENOSCOPY     EYE MUSCLE SURGERY     LESION REMOVAL     [place] IMPEDANCE STUDY N/A 10/30/2018   Procedure: [place] IMPEDANCE STUDY;  Surgeon: Wilford Corner, MD;  Location: [facility];  Service: Endoscopy;  Laterality: N/A;   TONSILLECTOMY     WISDOM TOOTH EXTRACTION      OB History     Gravida      Para      Term      Preterm      AB      Living  2      SAB      IAB      Ectopic      Multiple      Live Births               Home Medications    Prior to Admission medications   Medication Sig Start Date End Date Taking? Authorizing Provider  amLODipine (NORVASC) 2.5 MG tablet Take 2.5 mg by mouth daily. 04/25/21  Yes  [provider]  aspirin EC 81 MG tablet Take 81 mg by mouth daily.   Yes [provider]  cetirizine (ZYRTEC) 10 MG tablet Take 10 mg by mouth daily.   Yes [provider]  denosumab (PROLIA) 60 MG/ML SOSY injection Inject 60 mg into the skin every 6 (six) months.   Yes [provider]  gabapentin (NEURONTIN) 100 MG capsule Take 2 capsules (200 mg total) by mouth 3 (three) times daily. 05/31/22  Yes Spero Geralds, MD  Menaquinone-7 (VITAMIN K2) 100 MCG CAPS See admin instructions.   Yes [provider]  mirabegron ER (MYRBETRIQ) 50 MG TB24 tablet Take 50 mg by mouth daily.   Yes [provider]  rosuvastatin (CRESTOR) 20 MG tablet Take 20 mg by mouth daily.   Yes [provider]  VITAMIN D, CHOLECALCIFEROL, PO Take by mouth.   Yes [provider]  lactose free nutrition (BOOST) LIQD See admin instructions.    [provider]  Family History Family History  Problem Relation Age of Onset   Heart disease Father    Heart disease Brother    Breast cancer Neg Hx     Social History Social History   Tobacco Use   Smoking status: Never   Smokeless tobacco: Never  Substance Use Topics   Alcohol use: No   Drug use: No     Allergies   Tobramycin sulfate and Penicillin g   Review of Systems Review of Systems  Skin:  Positive for rash.     Physical Exam Triage Vital Signs ED Triage Vitals [10/17/22 1646]  Enc Vitals Group     BP      Pulse      Resp      Temp      Temp src      SpO2      Weight      Height      Head Circumference      Peak Flow      Pain Score 0     Pain Loc      Pain Edu?      Excl. in [place]?    No data found.  Updated Vital Signs There were no vitals taken for this visit.  Visual Acuity Right Eye Distance:   Left Eye Distance:   Bilateral Distance:    Right Eye Near:   Left Eye Near:    Bilateral Near:     Physical Exam Vitals reviewed.  Constitutional:       Appearance: Normal appearance.  HENT:     Head:   Skin:    Findings: Rash present. Rash is vesicular.  Neurological:     General: No focal deficit present.     Mental Status: She is alert and oriented to person, place, and time.  Psychiatric:        Mood and Affect: Mood normal.        Behavior: Behavior normal.      UC Treatments / Results  Labs (all labs ordered are listed, but only abnormal results are displayed) Labs Reviewed - No data to display  EKG   Radiology No results found.  Procedures Procedures (including critical care time)  Medications Ordered in UC Medications - No data to display  Initial Impression / Assessment and Plan / UC Course  I have reviewed the triage vital signs and the nursing notes.  Pertinent labs & imaging results that were available during my care of the patient were reviewed by me and considered in my medical decision making (see chart for details).   Patient presenting erythematous and vesicular rash in the left sided trigeminal dermatome.  Most reasonable diagnosis is [place] outbreak and will treat with Valtrex.   Final Clinical Impressions(s) / UC Diagnoses   Final diagnoses:  None   Discharge Instructions   None    ED Prescriptions   None    PDMP not reviewed this encounter.   Rose Phi, FNP 10/17/22 1708    Rose Phi, [place] 10/17/22 1709

## 2022-12-21 LAB — LAB REPORT - SCANNED: EGFR: 62

## 2022-12-24 ENCOUNTER — Telehealth: Payer: Self-pay

## 2022-12-24 NOTE — Telephone Encounter (Signed)
Transfer from Dr. Layne B[place]  Last Prolia inj 07/12/22 Next Prolia inj due 01/12/23

## 2022-12-25 ENCOUNTER — Encounter: Payer: Self-pay | Admitting: Family Medicine

## 2022-12-25 ENCOUNTER — Ambulatory Visit (INDEPENDENT_AMBULATORY_CARE_PROVIDER_SITE_OTHER): Payer: Medicare Other | Admitting: Family Medicine

## 2022-12-25 VITALS — BP 128/72 | Ht 63.0 in | Wt 113.6 lb

## 2022-12-25 DIAGNOSIS — M25559 Pain in unspecified hip: Secondary | ICD-10-CM

## 2022-12-25 DIAGNOSIS — M810 Age-related osteoporosis without current pathological fracture: Secondary | ICD-10-CM

## 2022-12-25 NOTE — Progress Notes (Signed)
PCP: Derinda Late, MD  Subjective:   HPI: Patient is a 77 y.o. female here for osteoporosis.  Patient sent as referral from Dr. Layne Benton to take over osteoporosis management Prior treatment: Prolia - last injection 07/12/22 History of Hip, Spine, or Wrist Fracture: no Heart disease or stroke: no Cancer: no Kidney Disease: no Gastric/Peptic Ulcer: no Gastric bypass surgery: no Severe GERD: no History of seizures: no Age at Menopause: 18 Calcium intake: yes Vitamin D intake: 1000IU daily Hormone replacement therapy: yes previously Smoking history: never Alcohol: none Exercise: pickleball 2-3x/week Major dental work in past year: no Parents with hip/spine fracture: no but dad had osteoporosis  Past Medical History:  Diagnosis Date   Aortic atherosclerosis ([place])    Cataract    CNS cysticercosis    Cough variant asthma    Dysphagia    Fasciculation    High risk medication use    Hoarseness    Hypercholesterolemia    Menopausal flushing    Onychomycosis    Osteoporosis    Other seborrheic dermatitis    Sleep apnea    Trouble swallowing    Vaginal dryness    Vitamin D deficiency     Current Outpatient Medications on File Prior to Visit  Medication Sig Dispense Refill   amLODipine (NORVASC) 2.5 MG tablet Take 2.5 mg by mouth daily.     aspirin EC 81 MG tablet Take 81 mg by mouth daily.     cetirizine (ZYRTEC) 10 MG tablet Take 10 mg by mouth daily.     denosumab (PROLIA) 60 MG/ML SOSY injection Inject 60 mg into the skin every 6 (six) months.     gabapentin (NEURONTIN) 100 MG capsule Take 2 capsules (200 mg total) by mouth 3 (three) times daily. 180 capsule 11   lactose free nutrition (BOOST) LIQD See admin instructions.     Menaquinone-7 (VITAMIN K2) 100 MCG CAPS See admin instructions.     mirabegron ER (MYRBETRIQ) 50 MG TB24 tablet Take 50 mg by mouth daily.     rosuvastatin (CRESTOR) 20 MG tablet Take 20 mg by mouth daily.     valACYclovir (VALTREX) 1000 MG  tablet Take 1 tablet (1,000 mg total) by mouth 3 (three) times daily. 21 tablet 0   VITAMIN D, CHOLECALCIFEROL, PO Take by mouth.     No current facility-administered medications on file prior to visit.    Past Surgical History:  Procedure Laterality Date   ABDOMINAL HYSTERECTOMY     ESOPHAGEAL MANOMETRY N/A 10/30/2018   Procedure: ESOPHAGEAL MANOMETRY (EM);  Surgeon: Wilford Corner, MD;  Location: [facility];  Service: Endoscopy;  Laterality: N/A;   ESOPHAGOGASTRODUODENOSCOPY     EYE MUSCLE SURGERY     LESION REMOVAL     [place] IMPEDANCE STUDY N/A 10/30/2018   Procedure: [place] IMPEDANCE STUDY;  Surgeon: Wilford Corner, MD;  Location: [facility];  Service: Endoscopy;  Laterality: N/A;   TONSILLECTOMY     WISDOM TOOTH EXTRACTION      Allergies  Allergen Reactions   Tobramycin Sulfate Other (See Comments)    Redness/tearing   Penicillin G Rash    BP 128/72   Ht 5\' 3"  (1.6 m)   Wt 113 lb 9.6 oz (51.5 kg)   BMI 20.12 kg/m       No data to display              No data to display              Objective:  Physical  Exam:  Gen: NAD, comfortable in exam room  Labs 12/21/22: CMP normal including Calcium 9.4; CBC normal 25-OH vitamin D 49. Dexa 07/26/21 T scores: Spine -2.7, left fem neck -2.3   Assessment & Plan:  1. Osteoporosis - Last Dexa 07/26/21 with T scores -2.7 in spine, -2.3 in left femoral neck.  Has done well with prolia and would like to continue - recent calcium and vitamin D levels normal.  Prolia due 01/12/23.  Continue calcium, vitamin D, exercise.  She also asked about trochanteric bursitis she's had in past - interested in possibly doing rehab at home (has PT who comes out at that facility) - will contact us if she would like referral.  Total visit time 30 minutes including documentation.

## 2022-12-25 NOTE — Addendum Note (Signed)
Addended by: Cyd Silence on: 12/25/2022 04:43 PM   Modules accepted: Orders

## 2022-12-25 NOTE — Telephone Encounter (Signed)
Prolia VOB initiated via [email or web address]  Last Prolia inj 07/12/22 Next Prolia inj due 01/12/23

## 2022-12-30 NOTE — Telephone Encounter (Signed)
Pt is ready for scheduling on or after 01/12/23  Out-of-pocket cost due at time of visit: $0  Primary: Medicare Prolia co-insurance: 20% (approximately $302) Admin fee co-insurance: 20% (approximately $25)  Deductible: $240 of $240 met  Secondary: [employer] Plan G Prolia co-insurance: Covers Medicare Part B co-insurance Admin fee co-insurance: Covers Medicare Part B co-insurance  Deductible: does NOT cover Medicare deductible  Prior Auth: NOT required PA# Valid:   ** This summary of benefits is an estimation of the patient's out-of-pocket cost. Exact cost may vary based on individual plan coverage.

## 2023-01-18 ENCOUNTER — Ambulatory Visit (INDEPENDENT_AMBULATORY_CARE_PROVIDER_SITE_OTHER): Payer: Medicare Other | Admitting: Family Medicine

## 2023-01-18 DIAGNOSIS — M810 Age-related osteoporosis without current pathological fracture: Secondary | ICD-10-CM | POA: Diagnosis present

## 2023-01-18 MED ORDER — DENOSUMAB 60 MG/ML ~~LOC~~ SOSY
60.0000 mg | PREFILLED_SYRINGE | Freq: Once | SUBCUTANEOUS | Status: AC
  Administered 2023-01-18: 60 mg via SUBCUTANEOUS

## 2023-01-18 NOTE — Progress Notes (Signed)
Patient given [place] prolia injection /ml in her left lower abdomen. Patient tolerated injection well without reaction at the injection site. Patient will schedule next injection, which is 6 months from today.

## 2023-01-30 NOTE — Telephone Encounter (Signed)
Last Prolia inj 01/18/23 Next Prolia inj due 07/21/23

## 2023-04-03 ENCOUNTER — Other Ambulatory Visit: Payer: Self-pay | Admitting: Family Medicine

## 2023-04-03 DIAGNOSIS — Z1231 Encounter for screening mammogram for malignant neoplasm of breast: Secondary | ICD-10-CM

## 2023-04-09 ENCOUNTER — Ambulatory Visit
Admission: RE | Admit: 2023-04-09 | Discharge: 2023-04-09 | Disposition: A | Discharge status: Home / Self Care | Payer: Medicare Other | Source: Ambulatory Visit | Attending: Family Medicine | Admitting: Family Medicine

## 2023-04-09 DIAGNOSIS — Z1231 Encounter for screening mammogram for malignant neoplasm of breast: Secondary | ICD-10-CM

## 2023-06-08 ENCOUNTER — Telehealth: Payer: Self-pay | Admitting: Internal Medicine

## 2023-06-08 ENCOUNTER — Other Ambulatory Visit (HOSPITAL_BASED_OUTPATIENT_CLINIC_OR_DEPARTMENT_OTHER): Payer: Self-pay

## 2023-06-08 NOTE — Telephone Encounter (Signed)
gabapentin (NEURONTIN) 100 MG capsule Pharmacy: [facility] in [place] on [address]

## 2023-06-12 ENCOUNTER — Telehealth: Payer: Self-pay | Admitting: Internal Medicine

## 2023-06-12 NOTE — Telephone Encounter (Signed)
Patient checking on refill for Gabapentin. Patient phone number is [phone].

## 2023-06-12 NOTE — Telephone Encounter (Signed)
Prolia VOB initiated via [email or web address]  Next Prolia inj DUE: 07/21/23

## 2023-06-13 NOTE — Telephone Encounter (Signed)
Please schedule pt a OV, we cannot send any refills until pt is seen.

## 2023-06-14 ENCOUNTER — Ambulatory Visit (INDEPENDENT_AMBULATORY_CARE_PROVIDER_SITE_OTHER): Payer: Medicare Other

## 2023-06-14 ENCOUNTER — Ambulatory Visit (INDEPENDENT_AMBULATORY_CARE_PROVIDER_SITE_OTHER): Payer: Medicare Other | Admitting: Adult Health

## 2023-06-14 ENCOUNTER — Encounter: Payer: Self-pay | Admitting: Adult Health

## 2023-06-14 VITALS — BP 114/68 | HR 78 | Ht 62.0 in | Wt 110.8 lb

## 2023-06-14 DIAGNOSIS — R053 Chronic cough: Secondary | ICD-10-CM

## 2023-06-14 MED ORDER — GABAPENTIN 100 MG PO CAPS: 200.0000 mg | ORAL_CAPSULE | Freq: Three times a day (TID) | ORAL | 11 refills | Status: DC

## 2023-06-14 NOTE — Patient Instructions (Signed)
Chest xray today  Gabapentin 100mg  2 capsules Three times a day. Can uselowest most effective dose to control for cough  Delsym 2 tsp Twice daily  As needed  cough .  Follow up with Dr. Celine Mans in 1 year and As needed

## 2023-06-14 NOTE — Assessment & Plan Note (Signed)
Chronic cough felt to be neurogenic in nature.  Patient's had an extensive workup.  Has had significant improvement in cough control on Neurontin 200 mg 3 times daily.  Long discussion with patient regarding potential side effects of Neurontin given her age.  She seems to be tolerating very well.  Her benefit outweighs the potential risk.  Refills were given. Will check chest x-ray today.  Plan  Patient Instructions  Chest xray today  Gabapentin 100mg  2 capsules Three times a day. Can uselowest most effective dose to control for cough  Delsym 2 tsp Twice daily  As needed  cough .  Follow up with Dr. Celine Mans in 1 year and As needed

## 2023-06-14 NOTE — Progress Notes (Signed)
@Patient  ID: Abigail Hawkins, female    DOB: 06/09/1946, 77 y.o.   MRN: [id number]  Chief Complaint  Patient presents with   Follow-up    Referring provider: Kandyce Rud, MD  HPI: 77 yo female followed for mild intermittent asthma and chronic cough felt secondary to neurogenic cough   TEST/EVENTS :  PFTs 03/12/18- FVC 1.95 (68%), FEV1 1.49 (69%), Ratio 77 (102%), DLCOunc 17.71 (72%)  Methoacholine challenge test 01/11/21 - POSITIVE. Drop in 23% of FEV1 at 4 mg methacholine    06/14/2023 Follow up : Chronic cough  Patient returns for a follow-up visit.  She was last seen February 2023.  She is followed for a chronic cough felt to be neurogenic in nature.  She had significant improvement in symptoms with gabapentin 200 mg 3 times daily. [facility] and says it does help some but mainly feels that gabapentin has really helped control her cough.  She has tried to lower the gabapentin dose to 100 mg twice daily and 200 at bedtime but saw a significant rise in her coughing episodes.  She says she is tolerating well.  She has had no dizziness or syncope.  Says she remains very active .  Plays pickle ball on a regular basis.  Felt to have possible intermittent asthma but denied any wheezing.  Had tried albuterol in the past without any perceived benefit.  Previous PFT showed no significant airflow obstruction.  She did have a methacholine challenge test in 2022 that was positive.  Previous high-resolution CT chest June 2019 apical scarring.  And some mild to moderate air trapping.    Allergies  Allergen Reactions   Tobramycin Sulfate Other (See Comments)    Redness/tearing   Penicillin G Rash    Immunization History  Administered Date(s) Administered   COVID-19, mRNA, vaccine(Comirnaty)12 years and older 12/11/2022   Fluad Quad(high Dose 65+) 07/08/2021   Influenza Split 06/30/2013, 06/27/2014, 07/13/2017, 06/18/2018   Influenza, High Dose Seasonal PF  07/10/2016, 07/13/2017, 06/18/2018, 07/08/2019   Influenza-Unspecified 06/02/2014, 06/08/2015, 07/10/2016, 07/08/2019, 07/02/2020   PFIZER(Purple Top)SARS-COV-2 Vaccination 10/07/2019, 10/28/2019, 07/15/2020, 01/28/2021   Pneumococcal Conjugate-13 06/01/2014   Pneumococcal Polysaccharide-23 11/08/2009   Td 07/10/2016   Tdap 10/25/2005   Zoster Recombinant(Shingrix) 02/02/2012   Zoster, Live 02/02/2012, 09/17/2020, 11/30/2020    Past Medical History:  Diagnosis Date   Aortic atherosclerosis (HCC)    Cataract    CNS cysticercosis    Cough variant asthma    Dysphagia    Fasciculation    High risk medication use    Hoarseness    Hypercholesterolemia    Menopausal flushing    Onychomycosis    Osteoporosis    Other seborrheic dermatitis    Sleep apnea    Trouble swallowing    Vaginal dryness    Vitamin D deficiency     Tobacco History: Social History   Tobacco Use  Smoking Status Never  Smokeless Tobacco Never   Counseling given: Not Answered   Outpatient Medications Prior to Visit  Medication Sig Dispense Refill   amLODipine (NORVASC) 2.5 MG tablet Take 2.5 mg by mouth daily.     aspirin EC 81 MG tablet Take 81 mg by mouth daily.     cetirizine (ZYRTEC) 10 MG tablet Take 10 mg by mouth daily.     denosumab (PROLIA) 60 MG/ML SOSY injection Inject 60 mg into the skin every 6 (six) months.     lactose free nutrition (BOOST) LIQD See  admin instructions.     Menaquinone-7 (VITAMIN K2) 100 MCG CAPS See admin instructions.     mirabegron ER (MYRBETRIQ) 50 MG TB24 tablet Take 50 mg by mouth daily.     rosuvastatin (CRESTOR) 20 MG tablet Take 20 mg by mouth daily.     VITAMIN D, CHOLECALCIFEROL, PO Take by mouth.     gabapentin (NEURONTIN) 100 MG capsule Take 2 capsules (200 mg total) by mouth 3 (three) times daily. 180 capsule 11   valACYclovir (VALTREX) 1000 MG tablet Take 1 tablet (1,000 mg total) by mouth 3 (three) times daily. 21 tablet 0   No facility-administered  medications prior to visit.     Review of Systems:   Constitutional:   No  weight loss, night sweats,  Fevers, chills, fatigue, or  lassitude.  HEENT:   No headaches,  Difficulty swallowing,  Tooth/dental problems, or  Sore throat,                No sneezing, itching, ear ache, nasal congestion, post nasal drip,   CV:  No chest pain,  Orthopnea, PND, swelling in lower extremities, anasarca, dizziness, palpitations, syncope.   GI  No heartburn, indigestion, abdominal pain, nausea, vomiting, diarrhea, change in bowel habits, loss of appetite, bloody stools.   Resp:   No chest wall deformity  Skin: no rash or lesions.  GU: no dysuria, change in color of urine, no urgency or frequency.  No flank pain, no hematuria   MS:  No joint pain or swelling.  No decreased range of motion.  No back pain.    Physical Exam  BP 114/68   Pulse 78   Ht 5\' 2"  (1.575 m)   Wt 110 lb 12.8 oz (50.3 kg)   SpO2 98%   BMI 20.27 kg/m   GEN: A/Ox3; pleasant , NAD, thin    HEENT:  [place]/AT,  NOSE-clear, THROAT-clear, no lesions, no postnasal drip or exudate noted.   NECK:  Supple w/ fair ROM; no JVD; normal carotid impulses w/o bruits; no thyromegaly or nodules palpated; no lymphadenopathy.    RESP  Clear  P & A; w/o, wheezes/ rales/ or rhonchi. no accessory muscle use, no dullness to percussion  CARD:  RRR, no m/r/g, no peripheral edema, pulses intact, no cyanosis or clubbing.  GI:   Soft & nt; nml bowel sounds; no organomegaly or masses detected.   Musco: Warm bil, no deformities or joint swelling noted.   Neuro: alert, no focal deficits noted.    Skin: Warm, no lesions or rashes    Lab Results:    BMET No results found for: "NA", "K", "CL", "CO2", "GLUCOSE", "BUN", "CREATININE", "CALCIUM", "GFRNONAA", "GFRAA"  BNP No results found for: "BNP"  ProBNP No results found for: "PROBNP"  Imaging: No results found.  Administration History     None          Latest Ref Rng &  Units 01/11/2021   10:50 AM 03/12/2018   11:58 AM  PFT Results  FVC-Pre L 1.97  1.95   FVC-Predicted Pre % 74  68   FVC-Post L 2.03  1.88   FVC-Predicted Post % 76  65   Pre FEV1/FVC % % 79  77   Post FEV1/FCV % % 78  81   FEV1-Pre L 1.55  1.49   FEV1-Predicted Pre % 78  69   FEV1-Post L 1.58  1.53   DLCO uncorrected ml/min/mmHg  17.71   DLCO UNC% %  72   DLCO  corrected ml/min/mmHg  18.17   DLCO COR %Predicted %  74   DLVA Predicted %  112   TLC L  3.89   TLC % Predicted %  77   RV % Predicted %  85     Lab Results  Component Value Date   NITRICOXIDE 17 04/29/2018        Assessment & Plan:   Cough Chronic cough felt to be neurogenic in nature.  Patient's had an extensive workup.  Has had significant improvement in cough control on Neurontin 200 mg 3 times daily.  Long discussion with patient regarding potential side effects of Neurontin given her age.  She seems to be tolerating very well.  Her benefit outweighs the potential risk.  Refills were given. Will check chest x-ray today.  Plan  Patient Instructions  Chest xray today  Gabapentin 100mg  2 capsules Three times a day. Can uselowest most effective dose to control for cough  Delsym 2 tsp Twice daily  As needed  cough .  Follow up with Dr. Celine Mans in 1 year and As needed        Rubye Oaks, NP 06/14/2023

## 2023-06-14 NOTE — Telephone Encounter (Signed)
Patient is scheduled to be seen by TP today 9/12. Nothing further needed.

## 2023-06-17 NOTE — Telephone Encounter (Signed)
Pt ready for scheduling on or after 07/21/23  Out-of-pocket cost due at time of visit: $0  Primary: Medicare Prolia co-insurance: 20% (approximately $320) Admin fee co-insurance: 20% (approximately $25)  Deductible: $240 of $240 met  Prior Auth NOT required  Secondary: [employer] Supplement Prolia co-insurance: Covers Medicare Part B co-insurance Admin fee co-insurance: Covers Medicare Part B co-insurance  Deductible: n/a  Prior Auth: NOT required PA# Valid:   ** This summary of benefits is an estimation of the patient's out-of-pocket cost. Exact cost may vary based on individual plan coverage.

## 2023-06-17 NOTE — Telephone Encounter (Addendum)
NO Prior Auth required for [employer]   Primary Insurance: Medicare COVERAGE DETAILS: Prolia and administration will be subject to a $240.00 deductible ($240.00 met) and 20.0% coinsurance. The benefits provided on this Verification of Benefits form are Medical Benefits and are the patient's In-Network benefits for Prolia. If you would like Pharmacy Benefits for Prolia, please call [phone].   Secondary Insurance: [employer] [place] Plan G COVERAGE DETAILS: For the Secondary MD Purchase access option, this is a Medicare Supplement Plan G and it covers the Medicare Part B co-insurance and 100% of the excess charges. This plan does not cover the Medicare Part B deductible of $240.00 which $240.00 is met. The benefits provided on this Verification of Benefits form are Medical Benefits and are the patient's In-Network benefits for Prolia. If you would like Pharmacy Benefits for Prolia, please call [phone].

## 2023-06-22 ENCOUNTER — Telehealth: Payer: Self-pay

## 2023-06-22 NOTE — Telephone Encounter (Signed)
  Julio Sicks, NP 06/22/2023  2:43 PM EDT     Please let patient know that her chest x-ray results came back today, they do show some increased markings right upper lung-this could be from bronchitis or early pneumonia . Recommend a Z-Pak No. 1 take as directed. And repeat chest x-ray in 4 weeks with office visit   Tried to call no answer   Creating telephone encounter because I am unable to locate results in triage pool.  Spoke to patient and relayed above results/recommendations. She stated that she had a CXR 3 years ago and was told that she has markings then and the provider felt that it was scarring from coughing. She denies any recent URI or bronchitis.  She would like to discuss results with Tammy directly.  Tammy, please advise. Thanks

## 2023-06-22 NOTE — Telephone Encounter (Signed)
Spoke with patient she has no discolored mucus or fever.  Says that she is back to her baseline chronic cough and does not wish to proceed with antibiotics.  Advised that we can repeat her x-ray on return visit so we will have comparison x-rays as her last high-resolution CT chest in 2019 showed some scarring.

## 2023-07-05 LAB — LAB REPORT - SCANNED: EGFR: 65

## 2023-07-10 ENCOUNTER — Encounter: Payer: Self-pay | Admitting: *Deleted

## 2023-07-23 ENCOUNTER — Ambulatory Visit (INDEPENDENT_AMBULATORY_CARE_PROVIDER_SITE_OTHER): Payer: Medicare Other | Admitting: Family Medicine

## 2023-07-23 VITALS — BP 136/62 | Ht 62.0 in | Wt 109.8 lb

## 2023-07-23 DIAGNOSIS — M810 Age-related osteoporosis without current pathological fracture: Secondary | ICD-10-CM

## 2023-07-23 DIAGNOSIS — M4126 Other idiopathic scoliosis, lumbar region: Secondary | ICD-10-CM | POA: Diagnosis not present

## 2023-07-23 MED ORDER — DENOSUMAB 60 MG/ML ~~LOC~~ SOSY
60.0000 mg | PREFILLED_SYRINGE | Freq: Once | SUBCUTANEOUS | Status: AC
Start: 2023-07-23 — End: 2023-07-23
  Administered 2023-07-23: 60 mg via SUBCUTANEOUS

## 2023-07-23 NOTE — Progress Notes (Signed)
Patient given [place] prolia injection 60mg/ml in her right lower abdomen. Patient tolerated injection well without reaction at the injection site. Patient will schedule next injection, which is 6 months from today. 

## 2023-07-23 NOTE — Patient Instructions (Addendum)
Start physical therapy for kyphosis, lordosis of your back. Get scoliosis x-rays after you leave today. Follow up with me in 6 weeks for reevaluation.  [facility] at [facility] Address: [address], [place], [place] [address] Phone: [phone]

## 2023-07-23 NOTE — Progress Notes (Signed)
PCP: Kandyce Rud, MD  Subjective:   HPI: Patient is a 77 y.o. female here for prolia and scoliosis.  Patient reports she's been told in past she has curvature of the spine. Family members have had pretty severe kyphosis and she feels this is happening to her as well as the scoliotic curve. She wanted to know if there was anything she could do to prevent this from getting worse.  Past Medical History:  Diagnosis Date   Aortic atherosclerosis (HCC)    Cataract    CNS cysticercosis    Cough variant asthma    Dysphagia    Fasciculation    High risk medication use    Hoarseness    Hypercholesterolemia    Menopausal flushing    Onychomycosis    Osteoporosis    Other seborrheic dermatitis    Sleep apnea    Trouble swallowing    Vaginal dryness    Vitamin D deficiency     Current Outpatient Medications on File Prior to Visit  Medication Sig Dispense Refill   amLODipine (NORVASC) 2.5 MG tablet Take 2.5 mg by mouth daily.     aspirin EC 81 MG tablet Take 81 mg by mouth daily.     cetirizine (ZYRTEC) 10 MG tablet Take 10 mg by mouth daily.     denosumab (PROLIA) 60 MG/ML SOSY injection Inject 60 mg into the skin every 6 (six) months.     gabapentin (NEURONTIN) 100 MG capsule Take 2 capsules (200 mg total) by mouth 3 (three) times daily. 180 capsule 11   lactose free nutrition (BOOST) LIQD See admin instructions.     Menaquinone-7 (VITAMIN K2) 100 MCG CAPS See admin instructions.     mirabegron ER (MYRBETRIQ) 50 MG TB24 tablet Take 50 mg by mouth daily.     rosuvastatin (CRESTOR) 20 MG tablet Take 20 mg by mouth daily.     VITAMIN D, CHOLECALCIFEROL, PO Take by mouth.     No current facility-administered medications on file prior to visit.    Past Surgical History:  Procedure Laterality Date   ABDOMINAL HYSTERECTOMY     ESOPHAGEAL MANOMETRY N/A 10/30/2018   Procedure: ESOPHAGEAL MANOMETRY (EM);  Surgeon: Charlott Rakes, MD;  Location: [facility];  Service: Endoscopy;   Laterality: N/A;   ESOPHAGOGASTRODUODENOSCOPY     EYE MUSCLE SURGERY     LESION REMOVAL     [place] IMPEDANCE STUDY N/A 10/30/2018   Procedure: PH IMPEDANCE STUDY;  Surgeon: Charlott Rakes, MD;  Location: [facility];  Service: Endoscopy;  Laterality: N/A;   TONSILLECTOMY     WISDOM TOOTH EXTRACTION      Allergies  Allergen Reactions   Tobramycin Sulfate Other (See Comments)    Redness/tearing   Penicillin G Rash    BP 136/62   Ht 5\' 2"  (1.575 m)   Wt 109 lb 12.8 oz (49.8 kg)   BMI 20.08 kg/m       No data to display              No data to display              Objective:  Physical Exam:  Gen: NAD, comfortable in exam room  Back: Mild lordosis and kyphosis on exam. No discomfort. No swelling, other deformity.   Assessment & Plan:  1. Scoliosis - reviewed chest x-ray which showed a likely mild scoliotic curve but we will proceed with dedicated films to assess for this.  We also discussed her kyphosis and lordosis -  encouraged physical therapy, focus on posture - she will start PT.  F/u in 6 weeks.

## 2023-07-25 ENCOUNTER — Ambulatory Visit
Admission: RE | Admit: 2023-07-25 | Discharge: 2023-07-25 | Disposition: A | Discharge status: Home / Self Care | Payer: Medicare Other | Source: Ambulatory Visit | Attending: Family Medicine | Admitting: Family Medicine

## 2023-07-25 ENCOUNTER — Ambulatory Visit
Admission: RE | Admit: 2023-07-25 | Discharge: 2023-07-25 | Disposition: A | Discharge status: Home / Self Care | Payer: Medicare Other | Attending: Family Medicine | Admitting: Family Medicine

## 2023-07-25 DIAGNOSIS — M4126 Other idiopathic scoliosis, lumbar region: Secondary | ICD-10-CM | POA: Insufficient documentation

## 2023-07-28 MED ORDER — DENOSUMAB 60 MG/ML ~~LOC~~ SOSY
60.0000 mg | PREFILLED_SYRINGE | Freq: Once | SUBCUTANEOUS | Status: AC
Start: 2024-01-22 — End: ?

## 2023-07-28 NOTE — Addendum Note (Signed)
Addended by: Dierdre Searles on: 07/28/2023 05:24 PM   Modules accepted: Orders

## 2023-08-04 NOTE — Telephone Encounter (Signed)
Last Prolia inj 07/23/23 Next Prolia inj due 01/22/24

## 2023-08-17 ENCOUNTER — Ambulatory Visit
Admission: RE | Admit: 2023-08-17 | Discharge: 2023-08-17 | Disposition: A | Discharge status: Home / Self Care | Payer: Medicare Other | Source: Ambulatory Visit | Attending: Family Medicine | Admitting: Family Medicine

## 2023-08-17 DIAGNOSIS — M810 Age-related osteoporosis without current pathological fracture: Secondary | ICD-10-CM | POA: Insufficient documentation

## 2023-09-03 ENCOUNTER — Ambulatory Visit (INDEPENDENT_AMBULATORY_CARE_PROVIDER_SITE_OTHER): Payer: Medicare Other | Admitting: Family Medicine

## 2023-09-03 VITALS — BP 137/62 | Ht 63.0 in | Wt 108.0 lb

## 2023-09-03 DIAGNOSIS — R29898 Other symptoms and signs involving the musculoskeletal system: Secondary | ICD-10-CM | POA: Diagnosis present

## 2023-09-03 NOTE — Progress Notes (Signed)
PCP: Kandyce Rud, MD  Subjective:   HPI: Patient is a 77 y.o. female here for ?scoliosis.  10/21: Patient reports she's been told in past she has curvature of the spine. Family members have had pretty severe kyphosis and she feels this is happening to her as well as the scoliotic curve. She wanted to know if there was anything she could do to prevent this from getting worse.  12/2: Patient reports she's doing well. Has been doing physical therapy (at least 4 visits) for core strengthening and also doing home exercises. Only question she has is regarding upper extremities feel weak just at 2am if she wakes in middle of night needing to use restroom - hard to push up to get out of bed. No issues other times of day. No injury. No rashes, headaches, joint swelling.  Past Medical History:  Diagnosis Date   Aortic atherosclerosis (HCC)    Cataract    CNS cysticercosis    Cough variant asthma    Dysphagia    Fasciculation    High risk medication use    Hoarseness    Hypercholesterolemia    Menopausal flushing    Onychomycosis    Osteoporosis    Other seborrheic dermatitis    Sleep apnea    Trouble swallowing    Vaginal dryness    Vitamin D deficiency     Current Outpatient Medications on File Prior to Visit  Medication Sig Dispense Refill   amLODipine (NORVASC) 2.5 MG tablet Take 2.5 mg by mouth daily.     aspirin EC 81 MG tablet Take 81 mg by mouth daily.     cetirizine (ZYRTEC) 10 MG tablet Take 10 mg by mouth daily.     denosumab (PROLIA) 60 MG/ML SOSY injection Inject 60 mg into the skin every 6 (six) months.     gabapentin (NEURONTIN) 100 MG capsule Take 2 capsules (200 mg total) by mouth 3 (three) times daily. 180 capsule 11   lactose free nutrition (BOOST) LIQD See admin instructions.     Menaquinone-7 (VITAMIN K2) 100 MCG CAPS See admin instructions.     mirabegron ER (MYRBETRIQ) 50 MG TB24 tablet Take 50 mg by mouth daily.     rosuvastatin (CRESTOR) 20 MG  tablet Take 20 mg by mouth daily.     VITAMIN D, CHOLECALCIFEROL, PO Take by mouth.     Current Facility-Administered Medications on File Prior to Visit  Medication Dose Route Frequency Provider Last Rate Last Admin   [START ON 01/22/2024] denosumab (PROLIA) injection 60 mg  60 mg Subcutaneous Once Nayson Traweek, Azucena Fallen, MD        Past Surgical History:  Procedure Laterality Date   ABDOMINAL HYSTERECTOMY     ESOPHAGEAL MANOMETRY N/A 10/30/2018   Procedure: ESOPHAGEAL MANOMETRY (EM);  Surgeon: Charlott Rakes, MD;  Location: [facility];  Service: Endoscopy;  Laterality: N/A;   ESOPHAGOGASTRODUODENOSCOPY     EYE MUSCLE SURGERY     LESION REMOVAL     PH IMPEDANCE STUDY N/A 10/30/2018   Procedure: PH IMPEDANCE STUDY;  Surgeon: Charlott Rakes, MD;  Location: [facility];  Service: Endoscopy;  Laterality: N/A;   TONSILLECTOMY     WISDOM TOOTH EXTRACTION      Allergies  Allergen Reactions   Tobramycin Sulfate Other (See Comments)    Redness/tearing   Penicillin G Rash    BP 137/62   Ht 5\' 3"  (1.6 m)   Wt 108 lb (49 kg)   BMI 19.13 kg/m  No data to display              No data to display              Objective:  Physical Exam:  Gen: NAD, comfortable in exam room  Back: Mild lordosis and kyphosis.  No other gross deformity. No TTP.  No midline or bony TTP. FROM. Strength LEs 5/5 all muscle groups including bilateral upper extremities Negative SLRs.   Assessment & Plan:  1. ? Scoliosis - curvature on radiographs mild and non-progressive.  She's going to continue physical therapy and transition to home exercises.  Given subjective upper extremity weakness will add for her to get some therapy for this as well. Tylenol if needed for soreness.  F/u prn.

## 2023-12-17 NOTE — Telephone Encounter (Signed)
 Prolia VOB initiated via [email or web address]  Next Prolia inj DUE: 01/22/24

## 2023-12-18 NOTE — Telephone Encounter (Signed)
 Patient is ready for scheduling on or after: 01/22/24 BUY AND BILL  Out-of-pocket cost due at time of visit: $46.47 (one time remaining deductible)  Primary: [facility] Medicare [place] co-insurance: 20%  Admin fee co-insurance: 20%  Deductible: $257 ($210.53 met)  Prior Auth: not req'd  Secondary: [employer]- med supp this is a Medicare Supplement Plan G and it covers the Medicare Part B co-insurance and 100% of the excess charges. This plan does not cover the Medicare Part B deductible of $257.00 which $210.53 is met.   ** This summary of benefits is an estimation of the patient's out-of-pocket cost. Exact cost may vary based on individual plan coverage.

## 2023-12-21 ENCOUNTER — Other Ambulatory Visit: Payer: Self-pay | Admitting: *Deleted

## 2023-12-21 DIAGNOSIS — M810 Age-related osteoporosis without current pathological fracture: Secondary | ICD-10-CM

## 2024-01-29 ENCOUNTER — Ambulatory Visit: Payer: Medicare Other | Admitting: Family Medicine

## 2024-01-31 ENCOUNTER — Ambulatory Visit (INDEPENDENT_AMBULATORY_CARE_PROVIDER_SITE_OTHER): Admitting: Family Medicine

## 2024-01-31 DIAGNOSIS — M810 Age-related osteoporosis without current pathological fracture: Secondary | ICD-10-CM

## 2024-01-31 MED ORDER — DENOSUMAB 60 MG/ML ~~LOC~~ SOSY
60.0000 mg | PREFILLED_SYRINGE | Freq: Once | SUBCUTANEOUS | Status: AC
  Administered 2024-01-31: 60 mg via SUBCUTANEOUS

## 2024-01-31 NOTE — Progress Notes (Signed)
Patient given [place] prolia injection 60mg/ml in her right lower abdomen. Patient tolerated injection well without reaction at the injection site. Patient will schedule next injection, which is 6 months from today. 

## 2024-02-26 ENCOUNTER — Other Ambulatory Visit: Payer: Self-pay | Admitting: Family Medicine

## 2024-02-26 DIAGNOSIS — Z1231 Encounter for screening mammogram for malignant neoplasm of breast: Secondary | ICD-10-CM

## 2024-04-03 ENCOUNTER — Other Ambulatory Visit: Payer: Self-pay | Admitting: Medical Genetics

## 2024-04-10 ENCOUNTER — Ambulatory Visit
Admission: RE | Admit: 2024-04-10 | Discharge: 2024-04-10 | Disposition: A | Discharge status: Home / Self Care | Source: Ambulatory Visit | Attending: Family Medicine | Admitting: Family Medicine

## 2024-04-10 DIAGNOSIS — Z1231 Encounter for screening mammogram for malignant neoplasm of breast: Secondary | ICD-10-CM

## 2024-07-07 ENCOUNTER — Ambulatory Visit: Admitting: Internal Medicine

## 2024-07-07 ENCOUNTER — Other Ambulatory Visit: Payer: Self-pay | Admitting: Adult Health

## 2024-07-14 ENCOUNTER — Encounter: Payer: Self-pay | Admitting: Internal Medicine

## 2024-07-14 ENCOUNTER — Ambulatory Visit: Admitting: Internal Medicine

## 2024-07-14 VITALS — BP 134/84 | HR 88 | Temp 98.2°F | Ht 62.0 in | Wt 103.0 lb

## 2024-07-14 DIAGNOSIS — M4127 Other idiopathic scoliosis, lumbosacral region: Secondary | ICD-10-CM

## 2024-07-14 DIAGNOSIS — J387 Other diseases of larynx: Secondary | ICD-10-CM | POA: Diagnosis not present

## 2024-07-14 DIAGNOSIS — R053 Chronic cough: Secondary | ICD-10-CM | POA: Diagnosis not present

## 2024-07-14 MED ORDER — GABAPENTIN 100 MG PO CAPS: 200.0000 mg | ORAL_CAPSULE | Freq: Three times a day (TID) | ORAL | 11 refills | Status: AC

## 2024-07-14 NOTE — Patient Instructions (Addendum)
 It was a pleasure to see you today!  Please schedule follow up as needed.   If my schedule is not open yet, we will contact you with a reminder closer to that time. Please call [phone] if you haven't heard from us  a month before, and always call us  sooner if issues or concerns arise. You can also send us  a message through MyChart, but but aware that this is not to be used for urgent issues and it may take up to 5-7 days to receive a reply. Please be aware that you will likely be able to view your results before I have a chance to respond to them. Please give us  5 business days to respond to any non-urgent results.   I am glad your cough is doing well.  Continue the gabapentin  200 mg in the morning, 100 mg in the afternoon, 200 mg at night.  I recommend going up to 200 mg 3 times a day since that was the dose you were on before that most controlled your cough.  We discussed that gabapentin  is technically a high risk medication for someone in your age group.  However as long as you are not having excessive sleepiness, dizziness, lightheadedness that would be contributing to falls it is reasonable to continue the gabapentin  since it is a relatively low dose.  Your chest x-ray to me looks stable from previous.  The main difference is the worsening of the scoliosis in the last 6 years.  I am okay with your primary care doctor continuing your gabapentin  for the cough.  Please come back and see us  sooner if you need us . Dr Geronimo, Dr. Adrien, Dr. Kassie

## 2024-07-14 NOTE — Progress Notes (Signed)
 Abigail Hawkins    [id number]    06/24/1946  Primary Care Physician:Babaoff, Jerona, MD Date of Appointment: 07/14/2024 Established Patient Visit  Chief complaint:   Chief Complaint  Patient presents with   Cough    Patient states gabapentin  is helping with the cough     HPI: Abigail Hawkins is a 78 y.o. woman with history of mild itnermittent asthma, chronic cough. Felt to be hyperfunctioning larynx and neurogenic cough.   Interval Updates: Here for annual follow up for neurogenic cough.   Current gabapentin  dosing is 200 mg in the morning, 200 mg in the night, 100 mg in the afternoon.   Feels some concerns for daytime sleepiness in the afternoon after lunch, or in th evening after dinner.   She is less steady than she used to be, having worsening gait instability. She is still playing pickleball but doesn't run anymore.   She does feel that the gabapentin  helps because when she tapered down the cough returned.    I have reviewed the patient's family social and past medical history and updated as appropriate.   Past Medical History:  Diagnosis Date   Aortic atherosclerosis    Cataract    CNS cysticercosis    Cough variant asthma    Dysphagia    Fasciculation    High risk medication use    Hoarseness    Hypercholesterolemia    Menopausal flushing    Onychomycosis    Osteoporosis    Other seborrheic dermatitis    Sleep apnea    Trouble swallowing    Vaginal dryness    Vitamin D deficiency     Past Surgical History:  Procedure Laterality Date   ABDOMINAL HYSTERECTOMY     ESOPHAGEAL MANOMETRY N/A 10/30/2018   Procedure: ESOPHAGEAL MANOMETRY (EM);  Surgeon: Dianna Specking, MD;  Location: [facility];  Service: Endoscopy;  Laterality: N/A;   ESOPHAGOGASTRODUODENOSCOPY     EYE MUSCLE SURGERY     LESION REMOVAL     PH IMPEDANCE STUDY N/A 10/30/2018   Procedure: PH IMPEDANCE STUDY;  Surgeon: Dianna Specking, MD;  Location: [facility];   Service: Endoscopy;  Laterality: N/A;   TONSILLECTOMY     WISDOM TOOTH EXTRACTION      Family History  Problem Relation Age of Onset   Heart disease Father    Heart disease Brother    Breast cancer Neg Hx     Social History   Occupational History   Not on file  Tobacco Use   Smoking status: Never   Smokeless tobacco: Never  Substance and Sexual Activity   Alcohol use: No   Drug use: No   Sexual activity: Yes     Physical Exam: Blood pressure 134/84, pulse 88, temperature 98.2 F (36.8 C), temperature source Oral, height 5' 2 (1.575 m), weight 103 lb (46.7 kg), SpO2 97%.  Gen:      No acute distress, normal voice, kyphoscoliosis ENT:  mmm Lungs:   ctab no wheezes or crackles CV:         RRR no mrg   Data Reviewed: Imaging: I have personally reviewed the CT Chest from 2019 which shows air trapping. Chest xray from 2024 shows kyphoscoliosis  PFTs:      Latest Ref Rng & Units 01/11/2021   10:50 AM 03/12/2018   11:58 AM  PFT Results  FVC-Pre L 1.97  1.95   FVC-Predicted Pre % 74  68   FVC-Post L  2.03  1.88   FVC-Predicted Post % 76  65   Pre FEV1/FVC % % 79  77   Post FEV1/FCV % % 78  81   FEV1-Pre L 1.55  1.49   FEV1-Predicted Pre % 78  69   FEV1-Post L 1.58  1.53   DLCO uncorrected ml/min/mmHg  17.71   DLCO UNC% %  72   DLCO corrected ml/min/mmHg  18.17   DLCO COR %Predicted %  74   DLVA Predicted %  112   TLC L  3.89   TLC % Predicted %  77   RV % Predicted %  85    I have personally reviewed the patient's PFTs and they show no airflow limitation  Methoacholine challenge test 01/11/21 - POSITIVE. Drop in 23% of FEV1 at 4 mg methacholine   Impendance testing Jan 2020 shows no significant reflux.  Echocardiogram LVEF normal, Grade 1 diastolic dysfunction, no significant valvular disease Nuclear stress EF: 74%. Blood pressure demonstrated a hypertensive response to exercise. There was no ST segment deviation noted during stress. The study is  normal. This is a low risk study. The left ventricular ejection fraction is hyperdynamic (>65%).   Normal pharmacologic nuclear stress test with no evidence for prior infarct or ischemia. LVEF 74%.    Labs:  Lab Results  Component Value Date   HGB 12.6 04/29/2018    Immunization status: Immunization History  Administered Date(s) Administered   Fluad Quad(high Dose 65+) 07/08/2021   Fluzone  Influenza virus vaccine,trivalent (IIV3), split virus 06/30/2013, 06/27/2014, 07/13/2017, 06/18/2018   INFLUENZA, HIGH DOSE SEASONAL PF 07/10/2016, 07/13/2017, 06/18/2018, 07/08/2019   Influenza-Unspecified 06/02/2014, 06/08/2015, 07/10/2016, 07/08/2019, 07/02/2020   PFIZER(Purple Top)SARS-COV-2 Vaccination 10/07/2019, 10/28/2019, 07/15/2020, 01/28/2021   Pfizer(Comirnaty)Fall Seasonal Vaccine 12 years and older 12/11/2022   Pneumococcal Conjugate-13 06/01/2014   Pneumococcal Polysaccharide-23 11/08/2009   Td 07/10/2016   Tdap 10/25/2005   Zoster Recombinant(Shingrix) 02/02/2012   Zoster, Live 02/02/2012, 09/17/2020, 11/30/2020    Assessment:  Chronic cough - multifactorial secondary to irritable larynx syndrome, neurogenic cough Positive Methacholine  challenge test. Not tolerant of any form of ICS. Not steroid responsive.  Kyphoscoliosis, progressing  Plan/Recommendations:  I am glad your cough is doing well.  Continue the gabapentin  200 mg in the morning, 100 mg in the afternoon, 200 mg at night.  I recommend going up to 200 mg 3 times a day since that was the dose you were on before that most controlled your cough.  We discussed that gabapentin  is technically a high risk medication for someone in your age group.  However as long as you are not having excessive sleepiness, dizziness, lightheadedness that would be contributing to falls it is reasonable to continue the gabapentin  since it is a relatively low dose.  Your chest x-ray to me looks stable from previous.  The main difference is  the worsening of the scoliosis in the last 6 years.  I am okay with your primary care doctor continuing your gabapentin  for the cough.  Please come back and see us  sooner if you need us . Dr Geronimo, Dr. Adrien, Dr. Kassie  I spent 30 minutes in the care of this patient today including pre-charting, chart review, review of results, face-to-face care, coordination of care and communication with consultants etc.).   Return to Care: Return if symptoms worsen or fail to improve. Mrs. Kabel wants to follow up as needed and have Dr. Diedra continue the prescribe the gabapentin .    Verdon Gore, MD Pulmonary and Critical Care  Medicine [facility] [phone]

## 2024-07-16 ENCOUNTER — Other Ambulatory Visit: Payer: Self-pay | Admitting: *Deleted

## 2024-07-16 DIAGNOSIS — M810 Age-related osteoporosis without current pathological fracture: Secondary | ICD-10-CM

## 2024-07-21 NOTE — Telephone Encounter (Signed)
 [employer]  Patient is ready for scheduling on or after: 07/24/24   Out-of-pocket cost due at time of visit: $0  Primary: [place]  Medicare [place]  co-insurance: 20% (approximately $353) Admin fee co-insurance: 20% (approximately $25)  Deductible: $257 of $257 met  Prior Auth: NOT required  Secondary: [employer] med supp this plan is a Medicare Supplement Plan G and it covers the Medicare Part B co-insurance and 100% of the excess charges. This plan does not cover the Medicare Part B deductible  ** This summary of benefits is an estimation of the patient's out-of-pocket cost. Exact cost may vary based on individual plan coverage.

## 2024-07-31 ENCOUNTER — Ambulatory Visit: Admitting: Family Medicine

## 2024-07-31 ENCOUNTER — Other Ambulatory Visit: Payer: Self-pay | Admitting: Medical Genetics

## 2024-07-31 DIAGNOSIS — Z006 Encounter for examination for normal comparison and control in clinical research program: Secondary | ICD-10-CM

## 2024-08-05 ENCOUNTER — Ambulatory Visit: Admitting: Family Medicine

## 2024-08-05 DIAGNOSIS — M810 Age-related osteoporosis without current pathological fracture: Secondary | ICD-10-CM | POA: Diagnosis present

## 2024-08-05 MED ORDER — DENOSUMAB 60 MG/ML ~~LOC~~ SOSY
60.0000 mg | PREFILLED_SYRINGE | Freq: Once | SUBCUTANEOUS | Status: AC
  Administered 2024-08-05: 60 mg via SUBCUTANEOUS

## 2024-08-05 NOTE — Progress Notes (Signed)
Patient given [place] prolia injection 60mg/ml in her right lower abdomen. Patient tolerated injection well without reaction at the injection site. Patient will schedule next injection, which is 6 months from today. 

## 2024-08-06 ENCOUNTER — Ambulatory Visit: Admitting: Internal Medicine

## 2024-08-06 ENCOUNTER — Ambulatory Visit (INDEPENDENT_AMBULATORY_CARE_PROVIDER_SITE_OTHER): Admitting: Family Medicine

## 2024-08-06 VITALS — BP 120/74 | Ht 62.0 in | Wt 103.0 lb

## 2024-08-06 DIAGNOSIS — R531 Weakness: Secondary | ICD-10-CM

## 2024-08-07 NOTE — Progress Notes (Signed)
 PCP: Diedra Lame, MD  Discussed the use of AI scribe software for clinical note transcription with the patient, who gave verbal consent to proceed.  History of Present Illness Abigail Hawkins is an 78 year old female who presents with sudden onset of whole-body weakness.  Generalized weakness - Sudden onset of whole-body weakness after exercise on October 25th - Unable to get out of bed without assistance on the day of onset; weakness persisted throughout the day - Weakness recurred on the day of the visit following a stressful morning and poor sleep due to missing nighttime medications - No associated pain, soreness, numbness, or tingling  Exercise history - Initiated new exercise regimen on October 15th, including barbell raises and rowing machine workouts - Performed exercise three times by October 24th - Increased barbell weight from five to 6.6 pounds and added resistance band exercises and stretching on October 24th  Lower extremity paresthesia - Itching sensation in legs upon standing in the morning - Itching managed by rubbing  Cognitive slowing - Feels physically slower - Difficulty processing information and recalling words - Attributes symptoms to aging  Neurogenic cough - Takes gabapentin  500 mg daily for neurogenic cough  Urinary incontinence - Takes Myrbetriq for urinary incontinence  Pontine angle arachnoid cyst - Previously identified right-sided pontine angle arachnoid cyst - Cyst considered non-problematic  Past Medical History:  Diagnosis Date   Aortic atherosclerosis    Cataract    CNS cysticercosis    Cough variant asthma    Dysphagia    Fasciculation    High risk medication use    Hoarseness    Hypercholesterolemia    Menopausal flushing    Onychomycosis    Osteoporosis    Other seborrheic dermatitis    Sleep apnea    Trouble swallowing    Vaginal dryness    Vitamin D deficiency     Current Outpatient Medications on File Prior to  Visit  Medication Sig Dispense Refill   amLODipine (NORVASC) 2.5 MG tablet Take 2.5 mg by mouth daily.     aspirin EC 81 MG tablet Take 81 mg by mouth daily.     cetirizine (ZYRTEC) 10 MG tablet Take 10 mg by mouth daily.     denosumab  (PROLIA ) 60 MG/ML SOSY injection Inject 60 mg into the skin every 6 (six) months.     gabapentin  (NEURONTIN ) 100 MG capsule Take 2 capsules (200 mg total) by mouth 3 (three) times daily. 180 capsule 11   lactose free nutrition (BOOST) LIQD See admin instructions. (Patient not taking: Reported on 07/14/2024)     Menaquinone-7 (VITAMIN K2) 100 MCG CAPS See admin instructions.     mirabegron ER (MYRBETRIQ) 50 MG TB24 tablet Take 50 mg by mouth daily.     rosuvastatin (CRESTOR) 20 MG tablet Take 20 mg by mouth daily.     VITAMIN D, CHOLECALCIFEROL, PO Take by mouth.     Current Facility-Administered Medications on File Prior to Visit  Medication Dose Route Frequency Provider Last Rate Last Admin   denosumab  (PROLIA ) injection 60 mg  60 mg Subcutaneous Once Bayden Gil, Ludie SAUNDERS, MD        Past Surgical History:  Procedure Laterality Date   ABDOMINAL HYSTERECTOMY     ESOPHAGEAL MANOMETRY N/A 10/30/2018   Procedure: ESOPHAGEAL MANOMETRY (EM);  Surgeon: Dianna Specking, MD;  Location: [facility];  Service: Endoscopy;  Laterality: N/A;   ESOPHAGOGASTRODUODENOSCOPY     EYE MUSCLE SURGERY     LESION REMOVAL  PH IMPEDANCE STUDY N/A 10/30/2018   Procedure: PH IMPEDANCE STUDY;  Surgeon: Dianna Specking, MD;  Location: [facility];  Service: Endoscopy;  Laterality: N/A;   TONSILLECTOMY     WISDOM TOOTH EXTRACTION      Allergies  Allergen Reactions   Tobramycin Sulfate Other (See Comments)    Redness/tearing   Tobramycin-Dexamethasone Other (See Comments)   Penicillin G Rash    BP 120/74   Ht 5' 2 (1.575 m)   Wt 103 lb (46.7 kg)   BMI 18.84 kg/m       No data to display              No data to display              Objective:  Physical  Exam:  Gen: NAD, comfortable in exam room  Neuro: CN 2-12 grossly intact Strength 5/5 all extremities except 5-/5 elbow extension bilaterally and left hip flexion. Sensation intact to light touch. Reflexes equal bilaterally in biceps, triceps, brachioradialis, patellar, and achilles tendons. No dysdiadochokinesia. No clonus. No cogwheel rigidity. Gait while slow appears normal without pedestal turning.  Assessment & Plan Generalized weakness  Generalized weakness post-exercise, sudden onset, no pain. Differential includes muscle breakdown or neurologic cause. However no pain, muscle rigidity to suggest the former.   - Referred to neurology for further evaluation and potential workup  Neurogenic cough Managed with gabapentin , effective in symptom control. No asthma, allergies, heart condition, or acid reflux. - Continue gabapentin  200 mg three times daily.  Urinary incontinence Managed with Myrbetriq, effective in symptom control. - Continue Myrbetriq 50 mg daily.  History of right cerebellopontine angle arachnoid cyst Right cerebellopontine angle arachnoid cyst, previously evaluated by neurosurgeon. No current symptoms or changes related to the cyst.  Scoliosis Curve of 18 degrees, not expected to progress in adults. No orthopedic concerns.

## 2024-08-29 LAB — GENECONNECT MOLECULAR SCREEN: Genetic Analysis Overall Interpretation: NEGATIVE

## 2024-11-20 ENCOUNTER — Ambulatory Visit: Admitting: Neurology

## 2024-12-09 ENCOUNTER — Ambulatory Visit: Admitting: Neurology

## 2025-02-03 ENCOUNTER — Ambulatory Visit: Admitting: Family Medicine
